# Patient Record
Sex: Female | Born: 1935 | Race: White | Hispanic: No | Marital: Married
Health system: Southern US, Community
[De-identification: ages and names within clinical notes are randomized; demographics above are authoritative.]

## PROBLEM LIST (undated history)

## (undated) DIAGNOSIS — E119 Type 2 diabetes mellitus without complications: Secondary | ICD-10-CM

## (undated) DIAGNOSIS — Z87442 Personal history of urinary calculi: Secondary | ICD-10-CM

## (undated) DIAGNOSIS — E213 Hyperparathyroidism, unspecified: Secondary | ICD-10-CM

## (undated) DIAGNOSIS — M1990 Unspecified osteoarthritis, unspecified site: Secondary | ICD-10-CM

## (undated) DIAGNOSIS — I10 Essential (primary) hypertension: Secondary | ICD-10-CM

## (undated) HISTORY — PX: APPENDECTOMY: SHX54

## (undated) HISTORY — DX: Hyperparathyroidism, unspecified: E21.3

## (undated) HISTORY — PX: ABDOMINAL HYSTERECTOMY: SHX81

## (undated) HISTORY — PX: CHOLECYSTECTOMY: SHX55

## (undated) HISTORY — DX: Essential (primary) hypertension: I10

---

## 2013-02-02 HISTORY — PX: PARATHYROIDECTOMY: SHX19

## 2015-02-25 DIAGNOSIS — E119 Type 2 diabetes mellitus without complications: Secondary | ICD-10-CM | POA: Diagnosis not present

## 2015-02-25 DIAGNOSIS — R350 Frequency of micturition: Secondary | ICD-10-CM | POA: Diagnosis not present

## 2015-02-25 DIAGNOSIS — D62 Acute posthemorrhagic anemia: Secondary | ICD-10-CM | POA: Diagnosis not present

## 2015-02-25 DIAGNOSIS — R81 Glycosuria: Secondary | ICD-10-CM | POA: Diagnosis not present

## 2015-05-06 DIAGNOSIS — H04123 Dry eye syndrome of bilateral lacrimal glands: Secondary | ICD-10-CM | POA: Diagnosis not present

## 2015-05-20 DIAGNOSIS — H04123 Dry eye syndrome of bilateral lacrimal glands: Secondary | ICD-10-CM | POA: Diagnosis not present

## 2015-07-02 DIAGNOSIS — E119 Type 2 diabetes mellitus without complications: Secondary | ICD-10-CM | POA: Diagnosis not present

## 2015-07-02 DIAGNOSIS — M2550 Pain in unspecified joint: Secondary | ICD-10-CM | POA: Diagnosis not present

## 2015-07-02 DIAGNOSIS — E213 Hyperparathyroidism, unspecified: Secondary | ICD-10-CM | POA: Diagnosis not present

## 2015-07-02 DIAGNOSIS — I10 Essential (primary) hypertension: Secondary | ICD-10-CM | POA: Diagnosis not present

## 2015-07-02 DIAGNOSIS — Z23 Encounter for immunization: Secondary | ICD-10-CM | POA: Diagnosis not present

## 2015-07-02 DIAGNOSIS — D500 Iron deficiency anemia secondary to blood loss (chronic): Secondary | ICD-10-CM | POA: Diagnosis not present

## 2015-07-02 DIAGNOSIS — E559 Vitamin D deficiency, unspecified: Secondary | ICD-10-CM | POA: Diagnosis not present

## 2015-07-02 DIAGNOSIS — E538 Deficiency of other specified B group vitamins: Secondary | ICD-10-CM | POA: Diagnosis not present

## 2015-07-02 DIAGNOSIS — I708 Atherosclerosis of other arteries: Secondary | ICD-10-CM | POA: Diagnosis not present

## 2015-07-22 DIAGNOSIS — E119 Type 2 diabetes mellitus without complications: Secondary | ICD-10-CM | POA: Diagnosis not present

## 2015-07-22 DIAGNOSIS — E538 Deficiency of other specified B group vitamins: Secondary | ICD-10-CM | POA: Diagnosis not present

## 2015-07-22 DIAGNOSIS — I708 Atherosclerosis of other arteries: Secondary | ICD-10-CM | POA: Diagnosis not present

## 2015-07-22 DIAGNOSIS — I10 Essential (primary) hypertension: Secondary | ICD-10-CM | POA: Diagnosis not present

## 2015-07-22 DIAGNOSIS — E213 Hyperparathyroidism, unspecified: Secondary | ICD-10-CM | POA: Diagnosis not present

## 2015-07-22 DIAGNOSIS — E559 Vitamin D deficiency, unspecified: Secondary | ICD-10-CM | POA: Diagnosis not present

## 2015-08-15 DIAGNOSIS — R600 Localized edema: Secondary | ICD-10-CM | POA: Diagnosis not present

## 2015-08-15 DIAGNOSIS — E213 Hyperparathyroidism, unspecified: Secondary | ICD-10-CM | POA: Diagnosis not present

## 2015-08-15 DIAGNOSIS — R609 Edema, unspecified: Secondary | ICD-10-CM | POA: Diagnosis not present

## 2015-08-15 DIAGNOSIS — R601 Generalized edema: Secondary | ICD-10-CM | POA: Diagnosis not present

## 2015-08-15 DIAGNOSIS — E119 Type 2 diabetes mellitus without complications: Secondary | ICD-10-CM | POA: Diagnosis not present

## 2015-08-20 DIAGNOSIS — M7122 Synovial cyst of popliteal space [Baker], left knee: Secondary | ICD-10-CM | POA: Diagnosis not present

## 2015-08-20 DIAGNOSIS — E119 Type 2 diabetes mellitus without complications: Secondary | ICD-10-CM | POA: Diagnosis not present

## 2015-08-20 DIAGNOSIS — E213 Hyperparathyroidism, unspecified: Secondary | ICD-10-CM | POA: Diagnosis not present

## 2015-08-20 DIAGNOSIS — I10 Essential (primary) hypertension: Secondary | ICD-10-CM | POA: Diagnosis not present

## 2015-08-20 DIAGNOSIS — R601 Generalized edema: Secondary | ICD-10-CM | POA: Diagnosis not present

## 2015-10-22 DIAGNOSIS — L570 Actinic keratosis: Secondary | ICD-10-CM | POA: Diagnosis not present

## 2015-10-22 DIAGNOSIS — L821 Other seborrheic keratosis: Secondary | ICD-10-CM | POA: Diagnosis not present

## 2015-12-17 DIAGNOSIS — Z23 Encounter for immunization: Secondary | ICD-10-CM | POA: Diagnosis not present

## 2016-01-02 DIAGNOSIS — I10 Essential (primary) hypertension: Secondary | ICD-10-CM | POA: Diagnosis not present

## 2016-01-02 DIAGNOSIS — M2550 Pain in unspecified joint: Secondary | ICD-10-CM | POA: Diagnosis not present

## 2016-01-02 DIAGNOSIS — E213 Hyperparathyroidism, unspecified: Secondary | ICD-10-CM | POA: Diagnosis not present

## 2016-01-02 DIAGNOSIS — R945 Abnormal results of liver function studies: Secondary | ICD-10-CM | POA: Diagnosis not present

## 2016-01-02 DIAGNOSIS — K8060 Calculus of gallbladder and bile duct with cholecystitis, unspecified, without obstruction: Secondary | ICD-10-CM | POA: Diagnosis not present

## 2016-01-02 DIAGNOSIS — E210 Primary hyperparathyroidism: Secondary | ICD-10-CM | POA: Diagnosis not present

## 2016-03-07 DIAGNOSIS — Z780 Asymptomatic menopausal state: Secondary | ICD-10-CM | POA: Diagnosis not present

## 2016-03-07 DIAGNOSIS — Z13820 Encounter for screening for osteoporosis: Secondary | ICD-10-CM | POA: Diagnosis not present

## 2016-03-07 DIAGNOSIS — E210 Primary hyperparathyroidism: Secondary | ICD-10-CM | POA: Diagnosis not present

## 2016-03-23 DIAGNOSIS — M25512 Pain in left shoulder: Secondary | ICD-10-CM | POA: Diagnosis not present

## 2016-12-04 DIAGNOSIS — Z23 Encounter for immunization: Secondary | ICD-10-CM | POA: Diagnosis not present

## 2016-12-08 DIAGNOSIS — R7303 Prediabetes: Secondary | ICD-10-CM | POA: Diagnosis not present

## 2016-12-08 DIAGNOSIS — Z79899 Other long term (current) drug therapy: Secondary | ICD-10-CM | POA: Diagnosis not present

## 2016-12-08 DIAGNOSIS — I10 Essential (primary) hypertension: Secondary | ICD-10-CM | POA: Diagnosis not present

## 2016-12-08 DIAGNOSIS — Z9889 Other specified postprocedural states: Secondary | ICD-10-CM | POA: Diagnosis not present

## 2016-12-11 DIAGNOSIS — E1165 Type 2 diabetes mellitus with hyperglycemia: Secondary | ICD-10-CM | POA: Diagnosis not present

## 2017-01-06 DIAGNOSIS — E1165 Type 2 diabetes mellitus with hyperglycemia: Secondary | ICD-10-CM | POA: Diagnosis not present

## 2017-01-13 DIAGNOSIS — E1165 Type 2 diabetes mellitus with hyperglycemia: Secondary | ICD-10-CM | POA: Diagnosis not present

## 2017-02-12 DIAGNOSIS — S134XXA Sprain of ligaments of cervical spine, initial encounter: Secondary | ICD-10-CM | POA: Diagnosis not present

## 2017-02-12 DIAGNOSIS — M9901 Segmental and somatic dysfunction of cervical region: Secondary | ICD-10-CM | POA: Diagnosis not present

## 2017-02-19 DIAGNOSIS — S134XXA Sprain of ligaments of cervical spine, initial encounter: Secondary | ICD-10-CM | POA: Diagnosis not present

## 2017-02-19 DIAGNOSIS — M9901 Segmental and somatic dysfunction of cervical region: Secondary | ICD-10-CM | POA: Diagnosis not present

## 2017-02-22 DIAGNOSIS — E1165 Type 2 diabetes mellitus with hyperglycemia: Secondary | ICD-10-CM | POA: Diagnosis not present

## 2017-02-26 DIAGNOSIS — M9901 Segmental and somatic dysfunction of cervical region: Secondary | ICD-10-CM | POA: Diagnosis not present

## 2017-02-26 DIAGNOSIS — S134XXA Sprain of ligaments of cervical spine, initial encounter: Secondary | ICD-10-CM | POA: Diagnosis not present

## 2017-03-03 DIAGNOSIS — M9901 Segmental and somatic dysfunction of cervical region: Secondary | ICD-10-CM | POA: Diagnosis not present

## 2017-03-03 DIAGNOSIS — S134XXA Sprain of ligaments of cervical spine, initial encounter: Secondary | ICD-10-CM | POA: Diagnosis not present

## 2017-03-10 DIAGNOSIS — M9901 Segmental and somatic dysfunction of cervical region: Secondary | ICD-10-CM | POA: Diagnosis not present

## 2017-03-10 DIAGNOSIS — S134XXA Sprain of ligaments of cervical spine, initial encounter: Secondary | ICD-10-CM | POA: Diagnosis not present

## 2017-03-17 DIAGNOSIS — S134XXA Sprain of ligaments of cervical spine, initial encounter: Secondary | ICD-10-CM | POA: Diagnosis not present

## 2017-03-17 DIAGNOSIS — M9901 Segmental and somatic dysfunction of cervical region: Secondary | ICD-10-CM | POA: Diagnosis not present

## 2017-03-19 DIAGNOSIS — S134XXA Sprain of ligaments of cervical spine, initial encounter: Secondary | ICD-10-CM | POA: Diagnosis not present

## 2017-03-19 DIAGNOSIS — M9901 Segmental and somatic dysfunction of cervical region: Secondary | ICD-10-CM | POA: Diagnosis not present

## 2017-03-24 DIAGNOSIS — M9901 Segmental and somatic dysfunction of cervical region: Secondary | ICD-10-CM | POA: Diagnosis not present

## 2017-03-24 DIAGNOSIS — S134XXA Sprain of ligaments of cervical spine, initial encounter: Secondary | ICD-10-CM | POA: Diagnosis not present

## 2017-03-31 DIAGNOSIS — M9901 Segmental and somatic dysfunction of cervical region: Secondary | ICD-10-CM | POA: Diagnosis not present

## 2017-03-31 DIAGNOSIS — S134XXA Sprain of ligaments of cervical spine, initial encounter: Secondary | ICD-10-CM | POA: Diagnosis not present

## 2017-04-12 DIAGNOSIS — I10 Essential (primary) hypertension: Secondary | ICD-10-CM | POA: Diagnosis not present

## 2017-04-12 DIAGNOSIS — M25511 Pain in right shoulder: Secondary | ICD-10-CM | POA: Diagnosis not present

## 2017-04-12 DIAGNOSIS — G8929 Other chronic pain: Secondary | ICD-10-CM | POA: Diagnosis not present

## 2017-04-12 DIAGNOSIS — E1165 Type 2 diabetes mellitus with hyperglycemia: Secondary | ICD-10-CM | POA: Diagnosis not present

## 2017-04-12 DIAGNOSIS — Z79899 Other long term (current) drug therapy: Secondary | ICD-10-CM | POA: Diagnosis not present

## 2017-04-12 DIAGNOSIS — M25512 Pain in left shoulder: Secondary | ICD-10-CM | POA: Diagnosis not present

## 2017-04-13 DIAGNOSIS — M50322 Other cervical disc degeneration at C5-C6 level: Secondary | ICD-10-CM | POA: Diagnosis not present

## 2017-04-13 DIAGNOSIS — M47812 Spondylosis without myelopathy or radiculopathy, cervical region: Secondary | ICD-10-CM | POA: Diagnosis not present

## 2017-04-13 DIAGNOSIS — M5033 Other cervical disc degeneration, cervicothoracic region: Secondary | ICD-10-CM | POA: Diagnosis not present

## 2017-04-13 DIAGNOSIS — M25511 Pain in right shoulder: Secondary | ICD-10-CM | POA: Diagnosis not present

## 2017-04-13 DIAGNOSIS — M50323 Other cervical disc degeneration at C6-C7 level: Secondary | ICD-10-CM | POA: Diagnosis not present

## 2017-04-13 DIAGNOSIS — M25512 Pain in left shoulder: Secondary | ICD-10-CM | POA: Diagnosis not present

## 2017-04-14 DIAGNOSIS — M9901 Segmental and somatic dysfunction of cervical region: Secondary | ICD-10-CM | POA: Diagnosis not present

## 2017-04-14 DIAGNOSIS — S134XXA Sprain of ligaments of cervical spine, initial encounter: Secondary | ICD-10-CM | POA: Diagnosis not present

## 2017-04-28 DIAGNOSIS — S134XXA Sprain of ligaments of cervical spine, initial encounter: Secondary | ICD-10-CM | POA: Diagnosis not present

## 2017-04-28 DIAGNOSIS — M9901 Segmental and somatic dysfunction of cervical region: Secondary | ICD-10-CM | POA: Diagnosis not present

## 2017-05-03 DIAGNOSIS — I10 Essential (primary) hypertension: Secondary | ICD-10-CM | POA: Diagnosis not present

## 2017-05-03 DIAGNOSIS — M25519 Pain in unspecified shoulder: Secondary | ICD-10-CM | POA: Diagnosis not present

## 2017-05-03 DIAGNOSIS — E119 Type 2 diabetes mellitus without complications: Secondary | ICD-10-CM | POA: Diagnosis not present

## 2017-05-26 DIAGNOSIS — S134XXA Sprain of ligaments of cervical spine, initial encounter: Secondary | ICD-10-CM | POA: Diagnosis not present

## 2017-05-26 DIAGNOSIS — M9901 Segmental and somatic dysfunction of cervical region: Secondary | ICD-10-CM | POA: Diagnosis not present

## 2017-08-13 ENCOUNTER — Other Ambulatory Visit (INDEPENDENT_AMBULATORY_CARE_PROVIDER_SITE_OTHER): Payer: Self-pay | Admitting: Physician Assistant

## 2017-08-13 ENCOUNTER — Ambulatory Visit
Admission: RE | Admit: 2017-08-13 | Discharge: 2017-08-13 | Disposition: A | Discharge status: Home / Self Care | Payer: Medicare Other | Source: Ambulatory Visit | Attending: Physician Assistant | Admitting: Physician Assistant

## 2017-08-13 DIAGNOSIS — M25512 Pain in left shoulder: Secondary | ICD-10-CM | POA: Diagnosis not present

## 2017-08-13 DIAGNOSIS — M25511 Pain in right shoulder: Secondary | ICD-10-CM | POA: Diagnosis not present

## 2017-08-13 DIAGNOSIS — M25561 Pain in right knee: Secondary | ICD-10-CM

## 2017-08-13 DIAGNOSIS — M25562 Pain in left knee: Secondary | ICD-10-CM

## 2017-08-13 DIAGNOSIS — M1712 Unilateral primary osteoarthritis, left knee: Secondary | ICD-10-CM | POA: Diagnosis not present

## 2017-08-13 DIAGNOSIS — M542 Cervicalgia: Secondary | ICD-10-CM | POA: Diagnosis not present

## 2017-08-13 DIAGNOSIS — M1711 Unilateral primary osteoarthritis, right knee: Secondary | ICD-10-CM | POA: Diagnosis not present

## 2017-08-13 DIAGNOSIS — M5136 Other intervertebral disc degeneration, lumbar region: Secondary | ICD-10-CM | POA: Diagnosis not present

## 2017-12-13 DIAGNOSIS — I10 Essential (primary) hypertension: Secondary | ICD-10-CM | POA: Diagnosis not present

## 2018-03-23 DIAGNOSIS — Z23 Encounter for immunization: Secondary | ICD-10-CM | POA: Diagnosis not present

## 2018-08-24 DIAGNOSIS — Z760 Encounter for issue of repeat prescription: Secondary | ICD-10-CM | POA: Diagnosis not present

## 2018-08-24 DIAGNOSIS — I10 Essential (primary) hypertension: Secondary | ICD-10-CM | POA: Diagnosis not present

## 2018-08-30 DIAGNOSIS — Z23 Encounter for immunization: Secondary | ICD-10-CM | POA: Diagnosis not present

## 2018-09-01 DIAGNOSIS — R21 Rash and other nonspecific skin eruption: Secondary | ICD-10-CM | POA: Diagnosis not present

## 2018-09-27 DIAGNOSIS — I10 Essential (primary) hypertension: Secondary | ICD-10-CM | POA: Diagnosis not present

## 2018-09-27 DIAGNOSIS — Z1159 Encounter for screening for other viral diseases: Secondary | ICD-10-CM | POA: Diagnosis not present

## 2018-09-27 DIAGNOSIS — E210 Primary hyperparathyroidism: Secondary | ICD-10-CM | POA: Diagnosis not present

## 2018-09-27 DIAGNOSIS — Z6828 Body mass index (BMI) 28.0-28.9, adult: Secondary | ICD-10-CM | POA: Diagnosis not present

## 2018-11-23 DIAGNOSIS — M791 Myalgia, unspecified site: Secondary | ICD-10-CM | POA: Diagnosis not present

## 2018-11-23 DIAGNOSIS — Z1159 Encounter for screening for other viral diseases: Secondary | ICD-10-CM | POA: Diagnosis not present

## 2018-11-23 DIAGNOSIS — R519 Headache, unspecified: Secondary | ICD-10-CM | POA: Diagnosis not present

## 2018-11-23 DIAGNOSIS — R509 Fever, unspecified: Secondary | ICD-10-CM | POA: Diagnosis not present

## 2018-12-20 DIAGNOSIS — M25572 Pain in left ankle and joints of left foot: Secondary | ICD-10-CM | POA: Diagnosis not present

## 2018-12-20 DIAGNOSIS — S8992XA Unspecified injury of left lower leg, initial encounter: Secondary | ICD-10-CM | POA: Diagnosis not present

## 2018-12-20 DIAGNOSIS — M7989 Other specified soft tissue disorders: Secondary | ICD-10-CM | POA: Diagnosis not present

## 2018-12-20 DIAGNOSIS — S99912A Unspecified injury of left ankle, initial encounter: Secondary | ICD-10-CM | POA: Diagnosis not present

## 2018-12-20 DIAGNOSIS — S99922A Unspecified injury of left foot, initial encounter: Secondary | ICD-10-CM | POA: Diagnosis not present

## 2018-12-28 ENCOUNTER — Other Ambulatory Visit: Payer: Self-pay

## 2019-03-29 DIAGNOSIS — E210 Primary hyperparathyroidism: Secondary | ICD-10-CM | POA: Insufficient documentation

## 2019-03-29 DIAGNOSIS — I10 Essential (primary) hypertension: Secondary | ICD-10-CM | POA: Insufficient documentation

## 2019-03-30 ENCOUNTER — Other Ambulatory Visit: Payer: Self-pay

## 2019-03-30 ENCOUNTER — Encounter: Payer: Self-pay | Admitting: Legal Medicine

## 2019-03-30 ENCOUNTER — Ambulatory Visit (INDEPENDENT_AMBULATORY_CARE_PROVIDER_SITE_OTHER): Payer: Medicare Other | Admitting: Legal Medicine

## 2019-03-30 DIAGNOSIS — I10 Essential (primary) hypertension: Secondary | ICD-10-CM | POA: Diagnosis not present

## 2019-03-30 DIAGNOSIS — E210 Primary hyperparathyroidism: Secondary | ICD-10-CM | POA: Diagnosis not present

## 2019-03-30 LAB — COMPREHENSIVE METABOLIC PANEL
ALT: 15 IU/L (ref 0–32)
AST: 14 IU/L (ref 0–40)
Albumin/Globulin Ratio: 1.7 (ref 1.2–2.2)
Albumin: 4.3 g/dL (ref 3.6–4.6)
Alkaline Phosphatase: 98 IU/L (ref 39–117)
BUN/Creatinine Ratio: 20 (ref 12–28)
BUN: 18 mg/dL (ref 8–27)
Bilirubin Total: 0.7 mg/dL (ref 0.0–1.2)
CO2: 23 mmol/L (ref 20–29)
Calcium: 10 mg/dL (ref 8.7–10.3)
Chloride: 98 mmol/L (ref 96–106)
Creatinine, Ser: 0.9 mg/dL (ref 0.57–1.00)
GFR calc Af Amer: 68 mL/min/{1.73_m2} (ref >59)
GFR calc non Af Amer: 59 mL/min/{1.73_m2} — ABNORMAL LOW (ref >59)
Globulin, Total: 2.5 g/dL (ref 1.5–4.5)
Glucose: 270 mg/dL — ABNORMAL HIGH (ref 65–99)
Potassium: 4.3 mmol/L (ref 3.5–5.2)
Sodium: 138 mmol/L (ref 134–144)
Total Protein: 6.8 g/dL (ref 6.0–8.5)

## 2019-03-30 LAB — CBC WITH DIFFERENTIAL/PLATELET
Basophils Absolute: 0.2 10*3/uL (ref 0.0–0.2)
Basos: 2 %
EOS (ABSOLUTE): 0.2 10*3/uL (ref 0.0–0.4)
Eos: 2 %
Hematocrit: 40.6 % (ref 34.0–46.6)
Hemoglobin: 12.1 g/dL (ref 11.1–15.9)
Immature Grans (Abs): 0.1 10*3/uL (ref 0.0–0.1)
Immature Granulocytes: 1 %
Lymphocytes Absolute: 0.8 10*3/uL (ref 0.7–3.1)
Lymphs: 11 %
MCH: 21.8 pg — ABNORMAL LOW (ref 26.6–33.0)
MCHC: 29.8 g/dL — ABNORMAL LOW (ref 31.5–35.7)
MCV: 73 fL — ABNORMAL LOW (ref 79–97)
Monocytes Absolute: 0.5 10*3/uL (ref 0.1–0.9)
Monocytes: 7 %
Neutrophils Absolute: 5.6 10*3/uL (ref 1.4–7.0)
Neutrophils: 77 %
Platelets: 194 10*3/uL (ref 150–450)
RBC: 5.56 x10E6/uL — ABNORMAL HIGH (ref 3.77–5.28)
RDW: 16.7 % — ABNORMAL HIGH (ref 11.7–15.4)
WBC: 7.3 10*3/uL (ref 3.4–10.8)

## 2019-03-30 NOTE — Assessment & Plan Note (Signed)
An individual care plan was established and reinforced today.  The patient's status was assessed using clinical findings on exam and labs or diagnostic tests. The patient's success at meeting treatment goals on disease specific evidence-based guidelines and found to be well controlled. SELF MANAGEMENT: The patient and I together assessed ways to personally work towards obtaining the recommended goals. RECOMMENDATIONS: avoid decongestants found in common cold remedies, decrease consumption of alcohol, perform routine monitoring of BP with home BP cuff, exercise, reduction of dietary salt, take medicines as prescribed, try not to miss doses and quit smoking.  Regular exercise and maintaining a healthy weight is needed.  Stress reduction may help. A CLINICAL SUMMARY including written plan identify barriers to care unique to individual due to social or financial issues.  We attempt to mutually create solutions for individual and family understanding.

## 2019-03-30 NOTE — Patient Instructions (Signed)
Osteoporosis  Osteoporosis happens when your bones get thin and weak. This can cause your bones to break (fracture) more easily. You can do things at home to make your bones stronger. Follow these instructions at home:  Activity  Exercise as told by your doctor. Ask your doctor what activities are safe for you. You should do: ? Exercises that make your muscles work to hold your body weight up (weight-bearing exercises). These include tai chi, yoga, and walking. ? Exercises to make your muscles stronger. One example is lifting weights. Lifestyle  Limit alcohol intake to no more than 1 drink a day for nonpregnant women and 2 drinks a day for men. One drink equals 12 oz of beer, 5 oz of wine, or 1 oz of hard liquor.  Do not use any products that have nicotine or tobacco in them. These include cigarettes and e-cigarettes. If you need help quitting, ask your doctor. Preventing falls  Use tools to help you move around (mobility aids) as needed. These include canes, walkers, scooters, and crutches.  Keep rooms well-lit and free of clutter.  Put away things that could make you trip. These include cords and rugs.  Install safety rails on stairs. Install grab bars in bathrooms.  Use rubber mats in slippery areas, like bathrooms.  Wear shoes that: ? Fit you well. ? Support your feet. ? Have closed toes. ? Have rubber soles or low heels.  Tell your doctor about all of the medicines you are taking. Some medicines can make you more likely to fall. General instructions  Eat plenty of calcium and vitamin D. These nutrients are good for your bones. Good sources of calcium and vitamin D include: ? Some fatty fish, such as salmon and tuna. ? Foods that have calcium and vitamin D added to them (fortified foods). For example, some breakfast cereals are fortified with calcium and vitamin D. ? Egg yolks. ? Cheese. ? Liver.  Take over-the-counter and prescription medicines only as told by your  doctor.  Keep all follow-up visits as told by your doctor. This is important. Contact a doctor if:  You have not been tested (screened) for osteoporosis and you are: ? A woman who is age 65 or older. ? A man who is age 70 or older. Get help right away if:  You fall.  You get hurt. Summary  Osteoporosis happens when your bones get thin and weak.  Weak bones can break (fracture) more easily.  Eat plenty of calcium and vitamin D. These nutrients are good for your bones.  Tell your doctor about all of the medicines that you take. This information is not intended to replace advice given to you by your health care provider. Make sure you discuss any questions you have with your health care provider. Document Revised: 01/01/2017 Document Reviewed: 11/13/2016 Elsevier Patient Education  2020 [employer]  

## 2019-03-30 NOTE — Assessment & Plan Note (Signed)
AN INDIVIDUAL CARE PLAN was established and reinforced today.  The patient's status was assessed using clinical findings on exam, labs, and other diagnostic testing. Patient's success at meeting treatment goals based on disease specific evidence-bassed guidelines and found to be in good control. RECOMMENDATIONS include maintaiing present medicines and treatment. She needs a current DEXA.

## 2019-03-30 NOTE — Progress Notes (Signed)
Established Patient Office Visit  Subjective:  Patient ID: Erin Webster, female    DOB: 03/06/1934  Age: 85 y.o. MRN: [id number]  CC:  Chief Complaint  Patient presents with  . hyperparathyroidism  . Hypertension    HPI [name] presents for Chronic visit  Patient presents for follow up of hypertension.  Patient tolerating coreg, lisinopril/HCTZ well with side effects.  Patient was diagnosed with hypertension 1991 so has been treated for hypertension for 30 years.Patient is working on maintaining diet and exercise regimen and follows up as directed. Complication include none  Patient has hyperparathyroidism in 2015.  She is well controlled.She had kidney stones.  Last DEXA 2015. No fractures or renal failure.  Past Medical History:  Diagnosis Date  . Hyperparathyroidism ([place])   . Hypertension     Past Surgical History:  Procedure Laterality Date  . CHOLECYSTECTOMY    . PARATHYROIDECTOMY  2015    Family History  Problem Relation Age of Onset  . Heart disease Mother     Social History   Socioeconomic History  . Marital status: Married    Spouse name: Not on file  . Number of children: Not on file  . Years of education: Not on file  . Highest education level: Not on file  Occupational History  . Occupation: retired  Tobacco Use  . Smoking status: Never Smoker  . Smokeless tobacco: Never Used  Substance and Sexual Activity  . Alcohol use: Yes    Comment: seldomn  . Drug use: Never  . Sexual activity: Not on file  Other Topics Concern  . Not on file  Social History Narrative  . Not on file   Social Determinants of Health   Financial Resource Strain:   . Difficulty of Paying Living Expenses: Not on file  Food Insecurity:   . Worried About Charity fundraiser in the Last Year: Not on file  . Ran Out of Food in the Last Year: Not on file  Transportation Needs:   . Lack of Transportation (Medical): Not on file  . Lack of Transportation  (Non-Medical): Not on file  Physical Activity:   . Days of Exercise per Week: Not on file  . Minutes of Exercise per Session: Not on file  Stress:   . Feeling of Stress : Not on file  Social Connections:   . Frequency of Communication with Friends and Family: Not on file  . Frequency of Social Gatherings with Friends and Family: Not on file  . Attends Religious Services: Not on file  . Active Member of Clubs or Organizations: Not on file  . Attends Archivist Meetings: Not on file  . Marital Status: Not on file  Intimate Partner Violence:   . Fear of Current or Ex-Partner: Not on file  . Emotionally Abused: Not on file  . Physically Abused: Not on file  . Sexually Abused: Not on file    Outpatient Medications Prior to Visit  Medication Sig Dispense Refill  . carvedilol (COREG) 12.5 MG tablet Take 12.5 mg by mouth 2 (two) times daily with a meal.    . lisinopril-hydrochlorothiazide (ZESTORETIC) 20-25 MG tablet Take 1 tablet by mouth daily.    . meclizine (ANTIVERT) 25 MG tablet Take 25 mg by mouth 3 (three) times daily as needed.     No facility-administered medications prior to visit.    No Known Allergies  ROS Review of Systems  Constitutional: Negative.   HENT: Negative.   Eyes: Negative.  Respiratory: Negative.   Cardiovascular: Negative.   Gastrointestinal: Negative.   Endocrine: Negative.   Genitourinary: Negative.   Musculoskeletal: Negative.   Skin: Negative.   Neurological: Negative.       Objective:    Physical Exam  Constitutional: She is oriented to person, place, and time. She appears well-developed and well-nourished.  HENT:  Head: Normocephalic and atraumatic.  Eyes: Pupils are equal, round, and reactive to light. Conjunctivae and EOM are normal.  Cardiovascular: Normal rate, regular rhythm and normal heart sounds.  Pulmonary/Chest: Effort normal and breath sounds normal.  Abdominal: Soft. Bowel sounds are normal.  Musculoskeletal:          General: Normal range of motion.     Cervical back: Normal range of motion and neck supple.  Neurological: She is alert and oriented to person, place, and time. She has normal reflexes.  Skin: Skin is warm and dry.  Psychiatric: She has a normal mood and affect.  Vitals reviewed.   BP 128/82   Pulse 76   Temp (!) 97 F (36.1 C)   Ht 5' 7"  (1.702 m)   Wt 174 lb 6.4 oz (79.1 kg)   SpO2 96%   BMI 27.31 kg/m  Wt Readings from Last 3 Encounters:  03/30/19 174 lb 6.4 oz (79.1 kg)     Health Maintenance Due  Topic Date Due  . TETANUS/TDAP  02/19/1955  . DEXA SCAN  02/18/2001  . PNA vac Low Risk Adult (1 of 2 - PCV13) 02/18/2001  . INFLUENZA VACCINE  09/03/2018    There are no preventive care reminders to display for this patient.  No results found for: TSH No results found for: WBC, HGB, HCT, MCV, PLT No results found for: NA, K, CHLORIDE, CO2, GLUCOSE, BUN, CREATININE, BILITOT, ALKPHOS, AST, ALT, PROT, ALBUMIN, CALCIUM, ANIONGAP, EGFR, GFR No results found for: CHOL No results found for: HDL No results found for: LDLCALC No results found for: TRIG No results found for: CHOLHDL No results found for: HGBA1C    Assessment & Plan:   Problem List Items Addressed This Visit      Cardiovascular and Mediastinum   Benign hypertension    An individual care plan was established and reinforced today.  The patient's status was assessed using clinical findings on exam and labs or diagnostic tests. The patient's success at meeting treatment goals on disease specific evidence-based guidelines and found to be well controlled. SELF MANAGEMENT: The patient and I together assessed ways to personally work towards obtaining the recommended goals. RECOMMENDATIONS: avoid decongestants found in common cold remedies, decrease consumption of alcohol, perform routine monitoring of BP with home BP cuff, exercise, reduction of dietary salt, take medicines as prescribed, try not to miss doses and  quit smoking.  Regular exercise and maintaining a healthy weight is needed.  Stress reduction may help. A CLINICAL SUMMARY including written plan identify barriers to care unique to individual due to social or financial issues.  We attempt to mutually create solutions for individual and family understanding.      Relevant Medications   lisinopril-hydrochlorothiazide (ZESTORETIC) 20-25 MG tablet   carvedilol (COREG) 12.5 MG tablet   Other Relevant Orders   CBC with Differential   Comprehensive metabolic panel     Endocrine   Primary hyperparathyroidism ([place])    AN INDIVIDUAL CARE PLAN was established and reinforced today.  The patient's status was assessed using clinical findings on exam, labs, and other diagnostic testing. Patient's success at meeting treatment goals based  on disease specific evidence-bassed guidelines and found to be in good control. RECOMMENDATIONS include maintaiing present medicines and treatment. She needs a current DEXA.      Relevant Orders   DG Bone Density      No orders of the defined types were placed in this encounter.   Follow-up: Return in about 6 months (around 09/27/2019) for fasting.    Reinaldo Meeker, MD

## 2019-03-31 NOTE — Progress Notes (Signed)
No anemia, blood cells are small add ferritin to lab for iron deficiency, glucose 270, kidney tests stable, liver tests ok,  lp

## 2019-04-03 ENCOUNTER — Other Ambulatory Visit: Payer: Self-pay

## 2019-04-03 DIAGNOSIS — R739 Hyperglycemia, unspecified: Secondary | ICD-10-CM

## 2019-04-03 DIAGNOSIS — E611 Iron deficiency: Secondary | ICD-10-CM

## 2019-04-12 ENCOUNTER — Encounter: Payer: Self-pay | Admitting: Legal Medicine

## 2019-04-12 ENCOUNTER — Other Ambulatory Visit: Payer: Self-pay | Admitting: Legal Medicine

## 2019-04-12 DIAGNOSIS — D508 Other iron deficiency anemias: Secondary | ICD-10-CM

## 2019-04-12 DIAGNOSIS — E1169 Type 2 diabetes mellitus with other specified complication: Secondary | ICD-10-CM | POA: Insufficient documentation

## 2019-04-12 LAB — SPECIMEN STATUS REPORT

## 2019-04-12 LAB — HGB A1C W/O EAG: Hgb A1c MFr Bld: 9.6 % — ABNORMAL HIGH (ref 4.8–5.6)

## 2019-04-12 LAB — FERRITIN: Ferritin: 11 ng/mL — ABNORMAL LOW (ref 15–150)

## 2019-04-12 MED ORDER — FERROUS SULFATE 324 (65 FE) MG PO TBEC: 1.0000 | DELAYED_RELEASE_TABLET | Freq: Every day | ORAL | 3 refills | Status: DC

## 2019-04-12 NOTE — Progress Notes (Signed)
A1c 9.6 very high. Iron stores low, patient is a new diabetic- need to see for NEW DIABETES, start oral iron lp

## 2019-04-20 ENCOUNTER — Ambulatory Visit (INDEPENDENT_AMBULATORY_CARE_PROVIDER_SITE_OTHER): Payer: Medicare Other | Admitting: Legal Medicine

## 2019-04-20 ENCOUNTER — Other Ambulatory Visit: Payer: Self-pay

## 2019-04-20 ENCOUNTER — Encounter: Payer: Self-pay | Admitting: Legal Medicine

## 2019-04-20 VITALS — BP 130/70 | HR 70 | Temp 96.9°F | Resp 17 | Ht 67.0 in | Wt 174.2 lb

## 2019-04-20 DIAGNOSIS — E1169 Type 2 diabetes mellitus with other specified complication: Secondary | ICD-10-CM | POA: Diagnosis not present

## 2019-04-20 DIAGNOSIS — E669 Obesity, unspecified: Secondary | ICD-10-CM

## 2019-04-20 MED ORDER — ONETOUCH CLUB LANCETS FINE PT MISC: 1.0000 "application " | Freq: Once | 5 refills | Status: DC

## 2019-04-20 MED ORDER — ONETOUCH ULTRA VI STRP: ORAL_STRIP | 12 refills | Status: DC

## 2019-04-20 MED ORDER — METFORMIN HCL 500 MG PO TABS
500.0000 mg | ORAL_TABLET | Freq: Two times a day (BID) | ORAL | 3 refills | Status: DC
Start: 2019-04-20 — End: 2023-03-25

## 2019-04-20 MED ORDER — ONETOUCH ULTRA 2 W/DEVICE KIT: 1.0000 | PACK | Freq: Every morning | 0 refills | Status: AC

## 2019-04-20 NOTE — Patient Instructions (Signed)

## 2019-04-20 NOTE — Progress Notes (Signed)
Established Patient Office Visit  Subjective:  Patient ID: Erin Webster, female    DOB: 16-Jul-1934  Age: 85 y.o. MRN: [id number]  CC:  Chief Complaint  Patient presents with  . Diabetes    Start Diabetic medication     HPI Erin Webster presents for start diabetes treatment.  A!c 9.6. She  nas no blurred vision or polyuria.  Some headaches.  Need to start on metformin. Erin Webster discussed diabetes and diet and demonstrated finger stick.  Past Medical History:  Diagnosis Date  . Hyperparathyroidism ([place])   . Hypertension     Past Surgical History:  Procedure Laterality Date  . CHOLECYSTECTOMY    . PARATHYROIDECTOMY  2015    Family History  Problem Relation Age of Onset  . Heart disease Mother     Social History   Socioeconomic History  . Marital status: Married    Spouse name: Not on file  . Number of children: Not on file  . Years of education: Not on file  . Highest education level: Not on file  Occupational History  . Occupation: retired  Tobacco Use  . Smoking status: Never Smoker  . Smokeless tobacco: Never Used  Substance and Sexual Activity  . Alcohol use: Yes    Alcohol/week: 1.0 standard drinks    Types: 1 Glasses of wine per week    Comment: Seldom  . Drug use: Never  . Sexual activity: Not Currently  Other Topics Concern  . Not on file  Social History Narrative  . Not on file   Social Determinants of Health   Financial Resource Strain:   . Difficulty of Paying Living Expenses:   Food Insecurity:   . Worried About Charity fundraiser in the Last Year:   . Arboriculturist in the Last Year:   Transportation Needs:   . Film/video editor (Medical):   Marland Kitchen Lack of Transportation (Non-Medical):   Physical Activity:   . Days of Exercise per Week:   . Minutes of Exercise per Session:   Stress:   . Feeling of Stress :   Social Connections:   . Frequency of Communication with Friends and Family:   . Frequency of Social Gatherings with  Friends and Family:   . Attends Religious Services:   . Active Member of Clubs or Organizations:   . Attends Archivist Meetings:   Marland Kitchen Marital Status:   Intimate Partner Violence:   . Fear of Current or Ex-Partner:   . Emotionally Abused:   Marland Kitchen Physically Abused:   . Sexually Abused:     Outpatient Medications Prior to Visit  Medication Sig Dispense Refill  . carvedilol (COREG) 12.5 MG tablet Take 12.5 mg by mouth 2 (two) times daily with a meal.    . ferrous sulfate 324 (65 Fe) MG TBEC Take 1 tablet (325 mg total) by mouth daily. 30 tablet 3  . lisinopril-hydrochlorothiazide (ZESTORETIC) 20-25 MG tablet Take 1 tablet by mouth daily.     No facility-administered medications prior to visit.    No Known Allergies  ROS Review of Systems  Constitutional: Negative.   HENT: Negative.   Eyes: Negative.   Respiratory: Negative.   Cardiovascular: Negative.   Gastrointestinal: Negative.   Endocrine: Negative.   Genitourinary: Negative.   Musculoskeletal: Negative.   Skin: Negative.   Neurological: Negative.   Psychiatric/Behavioral: Negative.       Objective:    Physical Exam  Constitutional: She appears well-developed and well-nourished.  HENT:  Head: Normocephalic and atraumatic.  Cardiovascular: Normal rate, regular rhythm and normal heart sounds.  Pulmonary/Chest: Effort normal and breath sounds normal.  Vitals reviewed.   BP 130/70 (BP Location: Right Arm, Patient Position: Sitting)   Pulse 70   Temp (!) 96.9 F (36.1 C) (Temporal)   Resp 17   Ht 5' 7"  (1.702 m)   Wt 174 lb 3.2 oz (79 kg)   BMI 27.28 kg/m  Wt Readings from Last 3 Encounters:  04/20/19 174 lb 3.2 oz (79 kg)  03/30/19 174 lb 6.4 oz (79.1 kg)   Diabetic Foot Exam - Simple   Simple Foot Form Diabetic Foot exam was performed with the following findings: Yes 04/20/2019 10:35 AM  Visual Inspection No deformities, no ulcerations, no other skin breakdown bilaterally: Yes Sensation  Testing Intact to touch and monofilament testing bilaterally: Yes Pulse Check Posterior Tibialis and Dorsalis pulse intact bilaterally: Yes Comments     Health Maintenance Due  Topic Date Due  . OPHTHALMOLOGY EXAM  Never done  . TETANUS/TDAP  Never done  . DEXA SCAN  Never done  . PNA vac Low Risk Adult (1 of 2 - PCV13) Never done  . INFLUENZA VACCINE  09/03/2018    There are no preventive care reminders to display for this patient.  No results found for: TSH Lab Results  Component Value Date   WBC 7.3 03/30/2019   HGB 12.1 03/30/2019   HCT 40.6 03/30/2019   MCV 73 (L) 03/30/2019   PLT 194 03/30/2019   Lab Results  Component Value Date   NA 138 03/30/2019   K 4.3 03/30/2019   CO2 23 03/30/2019   GLUCOSE 270 (H) 03/30/2019   BUN 18 03/30/2019   CREATININE 0.90 03/30/2019   BILITOT 0.7 03/30/2019   ALKPHOS 98 03/30/2019   AST 14 03/30/2019   ALT 15 03/30/2019   PROT 6.8 03/30/2019   ALBUMIN 4.3 03/30/2019   CALCIUM 10.0 03/30/2019   No results found for: CHOL No results found for: HDL No results found for: LDLCALC No results found for: TRIG No results found for: CHOLHDL Lab Results  Component Value Date   HGBA1C 9.6 (H) 03/30/2019      Assessment & Plan:   Problem List Items Addressed This Visit      Endocrine   Diabetes mellitus type 2 in obese [facility]) - Primary    Patient is to check her blood sugar fasting in AM and keep record.  Use metformin bid, follow up one month to evaluate progress.  Continue education.      Relevant Medications   metFORMIN (GLUCOPHAGE) 500 MG tablet   Blood Glucose Monitoring Suppl (ONE TOUCH ULTRA 2) w/Device KIT   glucose blood (ONETOUCH ULTRA) test strip   ONE TOUCH CLUB LANCETS MISC      Meds ordered this encounter  Medications  . metFORMIN (GLUCOPHAGE) 500 MG tablet    Sig: Take 1 tablet (500 mg total) by mouth 2 (two) times daily with a meal.    Dispense:  180 tablet    Refill:  3  . Blood Glucose Monitoring  Suppl (ONE TOUCH ULTRA 2) w/Device KIT    Sig: 1 kit by Does not apply route every morning.    Dispense:  1 kit    Refill:  0  . glucose blood (ONETOUCH ULTRA) test strip    Sig: Use as instructed    Dispense:  100 each    Refill:  12  . ONE TOUCH CLUB LANCETS  MISC    Sig: 1 application by Does not apply route once for 1 dose.    Dispense:  100 each    Refill:  5    Follow-up: Return in about 4 weeks (around 05/18/2019).    Reinaldo Meeker, MD

## 2019-04-20 NOTE — Assessment & Plan Note (Signed)
Patient is to check her blood sugar fasting in AM and keep record.  Use metformin bid, follow up one month to evaluate progress.  Continue education.

## 2019-04-26 ENCOUNTER — Other Ambulatory Visit: Payer: Self-pay

## 2019-04-26 DIAGNOSIS — E1169 Type 2 diabetes mellitus with other specified complication: Secondary | ICD-10-CM

## 2019-05-17 ENCOUNTER — Encounter: Payer: Self-pay | Admitting: Legal Medicine

## 2019-05-17 ENCOUNTER — Other Ambulatory Visit: Payer: Self-pay

## 2019-05-17 ENCOUNTER — Ambulatory Visit (INDEPENDENT_AMBULATORY_CARE_PROVIDER_SITE_OTHER): Payer: Medicare Other | Admitting: Legal Medicine

## 2019-05-17 ENCOUNTER — Other Ambulatory Visit: Payer: Self-pay | Admitting: Legal Medicine

## 2019-05-17 DIAGNOSIS — E669 Obesity, unspecified: Secondary | ICD-10-CM | POA: Diagnosis not present

## 2019-05-17 DIAGNOSIS — E1169 Type 2 diabetes mellitus with other specified complication: Secondary | ICD-10-CM | POA: Diagnosis not present

## 2019-05-17 DIAGNOSIS — Z6826 Body mass index (BMI) 26.0-26.9, adult: Secondary | ICD-10-CM | POA: Diagnosis not present

## 2019-05-17 LAB — POCT UA - MICROALBUMIN: Microalbumin Ur, POC: 10 mg/L

## 2019-05-17 NOTE — Assessment & Plan Note (Signed)
An individual care plan for diabetes was established and reinforced today.  The patient's status was assessed using clinical findings on exam, labs and diagnostic testing. Patient success at meeting goals based on disease specific evidence-based guidelines and found to be fair controlled. Medications were assessed and patient's understanding of the medical issues , including barriers were assessed. Recommend adherence to a diabetic diet, a graduated exercise program, HgbA1c level is checked quarterly, and urine microalbumin performed yearly .  Annual mono-filament sensation testing performed. Lower blood pressure and control hyperlipidemia is important. Get annual eye exams and annual flu shots and smoking cessation discussed.  Self management goals were discussed. We discussed increasing metformin to tid.  Follow up 2 months

## 2019-05-17 NOTE — Progress Notes (Signed)
Established Patient Office Visit  Subjective:  Patient ID: Erin Webster, female    DOB: 08-30-1934  Age: 85 y.o. MRN: [id number]  CC:  Chief Complaint  Patient presents with  . Diabetes    follo up of new medication for diabetes    HPI Erin Webster presents for follow up on diabetes.  Patient present with type 2 diabetes.  Specifically, this is type 2, nonisnulin requiring diabetes, complicated by hypertension/hyperlipidemia.  Compliance with treatment has been good; patient take medicines as directed, maintains diet and exercise regimen, follows up as directed, and is keeping glucose diary.  Date of  diagnosis 2021.  Depression screen has been performed.Tobacco screen nonsmoker. Current medicines for diabetes metformin.  Patient is on lisinopril for renal protection and diet for cholesterol control.  Patient performs foot exams daily and last ophthalmologic exam was this year. She was just started on metformin one month ago and BS are running 233 to 160. Last A1c was 9.6. Past Medical History:  Diagnosis Date  . Hyperparathyroidism ([place])   . Hypertension     Past Surgical History:  Procedure Laterality Date  . CHOLECYSTECTOMY    . PARATHYROIDECTOMY  2015    Family History  Problem Relation Age of Onset  . Heart disease Mother     Social History   Socioeconomic History  . Marital status: Married    Spouse name: Not on file  . Number of children: Not on file  . Years of education: Not on file  . Highest education level: Not on file  Occupational History  . Occupation: retired  Tobacco Use  . Smoking status: Never Smoker  . Smokeless tobacco: Never Used  Substance and Sexual Activity  . Alcohol use: Yes    Alcohol/week: 1.0 standard drinks    Types: 1 Glasses of wine per week    Comment: Seldom  . Drug use: Never  . Sexual activity: Not Currently  Other Topics Concern  . Not on file  Social History Narrative  . Not on file   Social Determinants of  Health   Financial Resource Strain:   . Difficulty of Paying Living Expenses:   Food Insecurity:   . Worried About Charity fundraiser in the Last Year:   . Arboriculturist in the Last Year:   Transportation Needs:   . Film/video editor (Medical):   Marland Kitchen Lack of Transportation (Non-Medical):   Physical Activity:   . Days of Exercise per Week:   . Minutes of Exercise per Session:   Stress:   . Feeling of Stress :   Social Connections:   . Frequency of Communication with Friends and Family:   . Frequency of Social Gatherings with Friends and Family:   . Attends Religious Services:   . Active Member of Clubs or Organizations:   . Attends Archivist Meetings:   Marland Kitchen Marital Status:   Intimate Partner Violence:   . Fear of Current or Ex-Partner:   . Emotionally Abused:   Marland Kitchen Physically Abused:   . Sexually Abused:     Outpatient Medications Prior to Visit  Medication Sig Dispense Refill  . Blood Glucose Monitoring Suppl (ONE TOUCH ULTRA 2) w/Device KIT 1 kit by Does not apply route every morning. 1 kit 0  . carvedilol (COREG) 12.5 MG tablet Take 12.5 mg by mouth 2 (two) times daily with a meal.    . ferrous sulfate 324 (65 Fe) MG TBEC Take 1 tablet (325 mg total)  by mouth daily. 30 tablet 3  . glucose blood (ONETOUCH ULTRA) test strip Use as instructed 100 each 12  . lisinopril-hydrochlorothiazide (ZESTORETIC) 20-25 MG tablet Take 1 tablet by mouth daily.    . metFORMIN (GLUCOPHAGE) 500 MG tablet Take 1 tablet (500 mg total) by mouth 2 (two) times daily with a meal. 180 tablet 3   No facility-administered medications prior to visit.    No Known Allergies  ROS Review of Systems  Constitutional: Negative.   HENT: Negative.   Eyes: Negative.   Respiratory: Negative.   Cardiovascular: Negative.   Gastrointestinal: Negative.   Endocrine: Negative.   Genitourinary: Negative.   Skin: Negative.   Neurological: Negative.   Psychiatric/Behavioral: Negative.         Objective:    Physical Exam  Constitutional: She is oriented to person, place, and time. She appears well-developed and well-nourished.  HENT:  Head: Normocephalic.  Right Ear: External ear normal.  Left Ear: External ear normal.  Eyes: Pupils are equal, round, and reactive to light. Conjunctivae and EOM are normal.  Cardiovascular: Normal rate, regular rhythm and normal heart sounds.  Pulmonary/Chest: Effort normal and breath sounds normal.  Neurological: She is alert and oriented to person, place, and time. She has normal reflexes.  Vitals reviewed.  Diabetic Foot Exam - Simple   Simple Foot Form Diabetic Foot exam was performed with the following findings: Yes 05/17/2019 10:13 AM  Visual Inspection No deformities, no ulcerations, no other skin breakdown bilaterally: Yes Sensation Testing Intact to touch and monofilament testing bilaterally: Yes Pulse Check Posterior Tibialis and Dorsalis pulse intact bilaterally: Yes Comments   The patient has a history of falls. I complete a risk assessment for falls. A plan of care for falls was documented. BP 130/70 (BP Location: Left Arm, Patient Position: Sitting)   Pulse 74   Temp 97.8 F (36.6 C) (Temporal)   Resp 17   Ht 5' 7"  (1.702 m)   Wt 170 lb 9.6 oz (77.4 kg)   BMI 26.72 kg/m  Wt Readings from Last 3 Encounters:  05/17/19 170 lb 9.6 oz (77.4 kg)  04/20/19 174 lb 3.2 oz (79 kg)  03/30/19 174 lb 6.4 oz (79.1 kg)     Health Maintenance Due  Topic Date Due  . OPHTHALMOLOGY EXAM  Never done  . TETANUS/TDAP  Never done  . DEXA SCAN  Never done  . PNA vac Low Risk Adult (1 of 2 - PCV13) Never done    There are no preventive care reminders to display for this patient.  No results found for: TSH Lab Results  Component Value Date   WBC 7.3 03/30/2019   HGB 12.1 03/30/2019   HCT 40.6 03/30/2019   MCV 73 (L) 03/30/2019   PLT 194 03/30/2019   Lab Results  Component Value Date   NA 138 03/30/2019   K 4.3 03/30/2019    CO2 23 03/30/2019   GLUCOSE 270 (H) 03/30/2019   BUN 18 03/30/2019   CREATININE 0.90 03/30/2019   BILITOT 0.7 03/30/2019   ALKPHOS 98 03/30/2019   AST 14 03/30/2019   ALT 15 03/30/2019   PROT 6.8 03/30/2019   ALBUMIN 4.3 03/30/2019   CALCIUM 10.0 03/30/2019   No results found for: CHOL No results found for: HDL No results found for: LDLCALC No results found for: TRIG No results found for: [facility] Lab Results  Component Value Date   HGBA1C 9.6 (H) 03/30/2019      Assessment & Plan:  Problem List Items Addressed This Visit      Endocrine   Diabetes mellitus type 2 in obese [facility])    An individual care plan for diabetes was established and reinforced today.  The patient's status was assessed using clinical findings on exam, labs and diagnostic testing. Patient success at meeting goals based on disease specific evidence-based guidelines and found to be fair controlled. Medications were assessed and patient's understanding of the medical issues , including barriers were assessed. Recommend adherence to a diabetic diet, a graduated exercise program, HgbA1c level is checked quarterly, and urine microalbumin performed yearly .  Annual mono-filament sensation testing performed. Lower blood pressure and control hyperlipidemia is important. Get annual eye exams and annual flu shots and smoking cessation discussed.  Self management goals were discussed. We discussed increasing metformin to tid.  Follow up 2 months      Relevant Orders   POCT UA - Microalbumin (Completed)     Other   BMI 26.0-26.9,adult    AN INDIVIDUAL CARE PLAN was established and reinforced today.  The patient's status was assessed using clinical findings on exam, labs, and other diagnostic testing. Patient's success at meeting treatment goals based on disease specific evidence-bassed guidelines and found to be in good control. RECOMMENDATIONS include maintaining present medicines and treatment.         No  orders of the defined types were placed in this encounter.   Follow-up: Return in about 2 months (around 07/17/2019) for fasting.    Reinaldo Meeker, MD

## 2019-05-17 NOTE — Assessment & Plan Note (Signed)
AN INDIVIDUAL CARE PLAN was established and reinforced today.  The patient's status was assessed using clinical findings on exam, labs, and other diagnostic testing. Patient's success at meeting treatment goals based on disease specific evidence-bassed guidelines and found to be in good control. RECOMMENDATIONS include maintaining present medicines and treatment. 

## 2019-05-25 ENCOUNTER — Telehealth: Payer: Self-pay | Admitting: Legal Medicine

## 2019-05-25 NOTE — Progress Notes (Signed)
  Chronic Care Management   Note  05/25/2019 Name: Erin Webster MRN: [id number] DOB: 10-24-1934  Erin Webster is a 85 y.o. year old female who is a primary care patient of Abigail Miyamoto, MD. I reached out to Gaylan Gerold by phone today in response to a referral sent by Ms. Versie Starks PCP, Abigail Miyamoto, MD.   Ms. Molden was given information about Chronic Care Management services today including:  1. CCM service includes personalized support from designated clinical staff supervised by her physician, including individualized plan of care and coordination with other care providers 2. 24/7 contact phone numbers for assistance for urgent and routine care needs. 3. Service will only be billed when office clinical staff spend 20 minutes or more in a month to coordinate care. 4. Only one practitioner may furnish and bill the service in a calendar month. 5. The patient may stop CCM services at any time (effective at the end of the month) by phone call to the office staff.   Patient did not agree to services and wishes to consider information provided before deciding about enrollment in care management services.   Follow up plan:   Lynnae January Upstream Scheduler

## 2019-05-31 ENCOUNTER — Other Ambulatory Visit: Payer: Self-pay

## 2019-05-31 ENCOUNTER — Encounter: Payer: Self-pay | Admitting: Legal Medicine

## 2019-05-31 ENCOUNTER — Ambulatory Visit (INDEPENDENT_AMBULATORY_CARE_PROVIDER_SITE_OTHER): Payer: Medicare Other | Admitting: Legal Medicine

## 2019-05-31 VITALS — BP 126/80 | HR 62 | Temp 97.3°F | Resp 16 | Ht 67.0 in | Wt 173.8 lb

## 2019-05-31 DIAGNOSIS — E1169 Type 2 diabetes mellitus with other specified complication: Secondary | ICD-10-CM

## 2019-05-31 DIAGNOSIS — E669 Obesity, unspecified: Secondary | ICD-10-CM

## 2019-05-31 LAB — COMPREHENSIVE METABOLIC PANEL
ALT: 17 IU/L (ref 0–32)
AST: 18 IU/L (ref 0–40)
Albumin/Globulin Ratio: 2.2 (ref 1.2–2.2)
Albumin: 4.4 g/dL (ref 3.6–4.6)
Alkaline Phosphatase: 80 IU/L (ref 39–117)
BUN/Creatinine Ratio: 18 (ref 12–28)
BUN: 14 mg/dL (ref 8–27)
Bilirubin Total: 0.8 mg/dL (ref 0.0–1.2)
CO2: 27 mmol/L (ref 20–29)
Calcium: 10 mg/dL (ref 8.7–10.3)
Chloride: 99 mmol/L (ref 96–106)
Creatinine, Ser: 0.77 mg/dL (ref 0.57–1.00)
GFR calc Af Amer: 83 mL/min/{1.73_m2} (ref >59)
GFR calc non Af Amer: 72 mL/min/{1.73_m2} (ref >59)
Globulin, Total: 2 g/dL (ref 1.5–4.5)
Glucose: 183 mg/dL — ABNORMAL HIGH (ref 65–99)
Potassium: 4.4 mmol/L (ref 3.5–5.2)
Sodium: 139 mmol/L (ref 134–144)
Total Protein: 6.4 g/dL (ref 6.0–8.5)

## 2019-05-31 LAB — HEMOGLOBIN A1C
Est. average glucose Bld gHb Est-mCnc: 194 mg/dL
Hgb A1c MFr Bld: 8.4 % — ABNORMAL HIGH (ref 4.8–5.6)

## 2019-05-31 NOTE — Assessment & Plan Note (Signed)
An individual care plan for diabetes was established and reinforced today.  The patient's status was assessed using clinical findings on exam, labs and diagnostic testing. Patient success at meeting goals based on disease specific evidence-based guidelines and found to be improving controlled. Medications were assessed and patient's understanding of the medical issues , including barriers were assessed. Recommend adherence to a diabetic diet, a graduated exercise program, HgbA1c level is checked quarterly, and urine microalbumin performed yearly .  Annual mono-filament sensation testing performed. Lower blood pressure and control hyperlipidemia is important. Get annual eye exams and annual flu shots and smoking cessation discussed.  Self management goals were discussed. 

## 2019-05-31 NOTE — Progress Notes (Signed)
Established Patient Office Visit  Subjective:  Patient ID: Erin Webster, female    DOB: 1934/03/24  Age: 85 y.o. MRN: [id number]  CC:  Chief Complaint  Patient presents with  . Diabetes    HPI Erin Webster presents for follow up diabetes.  She is now on metformin 51m bid.  Her sugars are about 160 average.  No low sugars.  Continue on diet.  Patient present with type 2 diabetes.  Specifically, this is type 2, noninsulin requiring diabetes, complicated by hypertension and hypercholesterolemia.  Compliance with treatment has been good; patient take medicines as directed, maintains diet and exercise regimen, follows up as directed, and is keeping glucose diary.  Date of  diagnosis 2021.  Depression screen has been performed.Tobacco screen nonsmoker. Current medicines for diabetes metformin TID.  Patient is on lisinopril for renal protection and nothing for cholesterol control.  Patient performs foot exams daily and last ophthalmologic exam was this year.  Past Medical History:  Diagnosis Date  . Hyperparathyroidism ([place]   . Hypertension     Past Surgical History:  Procedure Laterality Date  . CHOLECYSTECTOMY    . PARATHYROIDECTOMY  2015    Family History  Problem Relation Age of Onset  . Heart disease Mother     Social History   Socioeconomic History  . Marital status: Married    Spouse name: Not on file  . Number of children: Not on file  . Years of education: Not on file  . Highest education level: Not on file  Occupational History  . Occupation: retired  Tobacco Use  . Smoking status: Never Smoker  . Smokeless tobacco: Never Used  Substance and Sexual Activity  . Alcohol use: Yes    Alcohol/week: 1.0 standard drinks    Types: 1 Glasses of wine per week    Comment: Seldom  . Drug use: Never  . Sexual activity: Not Currently  Other Topics Concern  . Not on file  Social History Narrative  . Not on file   Social Determinants of Health   Financial  Resource Strain:   . Difficulty of Paying Living Expenses:   Food Insecurity:   . Worried About RCharity fundraiserin the Last Year:   . RArboriculturistin the Last Year:   Transportation Needs:   . LFilm/video editor(Medical):   .Marland KitchenLack of Transportation (Non-Medical):   Physical Activity:   . Days of Exercise per Week:   . Minutes of Exercise per Session:   Stress:   . Feeling of Stress :   Social Connections:   . Frequency of Communication with Friends and Family:   . Frequency of Social Gatherings with Friends and Family:   . Attends Religious Services:   . Active Member of Clubs or Organizations:   . Attends CArchivistMeetings:   .Marland KitchenMarital Status:   Intimate Partner Violence:   . Fear of Current or Ex-Partner:   . Emotionally Abused:   .Marland KitchenPhysically Abused:   . Sexually Abused:     Outpatient Medications Prior to Visit  Medication Sig Dispense Refill  . Blood Glucose Monitoring Suppl (ONE TOUCH ULTRA 2) w/Device KIT 1 kit by Does not apply route every morning. 1 kit 0  . carvedilol (COREG) 12.5 MG tablet Take 12.5 mg by mouth 2 (two) times daily with a meal.    . ferrous sulfate 324 (65 Fe) MG TBEC Take 1 tablet (325 mg total) by mouth daily. 3[place]  tablet 3  . glucose blood (ONETOUCH ULTRA) test strip Use as instructed 100 each 12  . lisinopril-hydrochlorothiazide (ZESTORETIC) 20-25 MG tablet Take 1 tablet by mouth daily.    . metFORMIN (GLUCOPHAGE) 500 MG tablet Take 1 tablet (500 mg total) by mouth 2 (two) times daily with a meal. 180 tablet 3   No facility-administered medications prior to visit.    No Known Allergies  ROS Review of Systems  Constitutional: Negative.   HENT: Negative.   Eyes: Negative.   Respiratory: Negative.   Cardiovascular: Negative.   Gastrointestinal: Negative.   Endocrine: Negative.   Genitourinary: Negative.   Musculoskeletal: Negative.   Skin: Negative.   Neurological: Negative.   Psychiatric/Behavioral: Negative.        Objective:    Physical Exam  Constitutional: She is oriented to person, place, and time. She appears well-developed and well-nourished.  HENT:  Head: Normocephalic and atraumatic.  Right Ear: External ear normal.  Left Ear: External ear normal.  Eyes: Pupils are equal, round, and reactive to light. Conjunctivae and EOM are normal.  Cardiovascular: Normal rate, regular rhythm, normal heart sounds and intact distal pulses.  Pulmonary/Chest: Effort normal and breath sounds normal.  Abdominal: Soft. Bowel sounds are normal.  Musculoskeletal:        General: Normal range of motion.     Comments: Foot exam normal  Neurological: She is alert and oriented to person, place, and time. She has normal reflexes.  Vitals reviewed.   BP 126/80   Pulse 62   Temp (!) 97.3 F (36.3 C)   Resp 16   Ht 5' 7"  (1.702 m)   Wt 173 lb 12.8 oz (78.8 kg)   SpO2 96%   BMI 27.22 kg/m  Wt Readings from Last 3 Encounters:  05/31/19 173 lb 12.8 oz (78.8 kg)  05/17/19 170 lb 9.6 oz (77.4 kg)  04/20/19 174 lb 3.2 oz (79 kg)     Health Maintenance Due  Topic Date Due  . COVID-19 Vaccine (1) Never done  . TETANUS/TDAP  Never done  . DEXA SCAN  Never done  . PNA vac Low Risk Adult (1 of 2 - PCV13) Never done    There are no preventive care reminders to display for this patient.  No results found for: TSH Lab Results  Component Value Date   WBC 7.3 03/30/2019   HGB 12.1 03/30/2019   HCT 40.6 03/30/2019   MCV 73 (L) 03/30/2019   PLT 194 03/30/2019   Lab Results  Component Value Date   NA 138 03/30/2019   K 4.3 03/30/2019   CO2 23 03/30/2019   GLUCOSE 270 (H) 03/30/2019   BUN 18 03/30/2019   CREATININE 0.90 03/30/2019   BILITOT 0.7 03/30/2019   ALKPHOS 98 03/30/2019   AST 14 03/30/2019   ALT 15 03/30/2019   PROT 6.8 03/30/2019   ALBUMIN 4.3 03/30/2019   CALCIUM 10.0 03/30/2019   No results found for: CHOL No results found for: HDL No results found for: LDLCALC No results  found for: TRIG No results found for: CHOLHDL Lab Results  Component Value Date   HGBA1C 9.6 (H) 03/30/2019      Assessment & Plan:   Problem List Items Addressed This Visit      Endocrine   Diabetes mellitus type 2 in obese [facility]) - Primary    An individual care plan for diabetes was established and reinforced today.  The patient's status was assessed using clinical findings on exam, labs and  diagnostic testing. Patient success at meeting goals based on disease specific evidence-based guidelines and found to be improving controlled. Medications were assessed and patient's understanding of the medical issues , including barriers were assessed. Recommend adherence to a diabetic diet, a graduated exercise program, HgbA1c level is checked quarterly, and urine microalbumin performed yearly .  Annual mono-filament sensation testing performed. Lower blood pressure and control hyperlipidemia is important. Get annual eye exams and annual flu shots and smoking cessation discussed.  Self management goals were discussed.      Relevant Orders   Hemoglobin A1c   Comprehensive metabolic panel      No orders of the defined types were placed in this encounter.   Follow-up: Return in about 3 months (around 08/30/2019) for fasting.    Reinaldo Meeker, MD

## 2019-06-01 NOTE — Progress Notes (Signed)
A1c now 8.4 down from 9.6- we still want under 8, glucose high 183, Kidneys ok, liver tests ok,

## 2019-07-20 ENCOUNTER — Other Ambulatory Visit: Payer: Self-pay

## 2019-09-15 ENCOUNTER — Other Ambulatory Visit: Payer: Self-pay | Admitting: Legal Medicine

## 2019-09-27 ENCOUNTER — Ambulatory Visit: Payer: Medicare Other | Admitting: Legal Medicine

## 2019-12-22 ENCOUNTER — Other Ambulatory Visit: Payer: Self-pay | Admitting: Family Medicine

## 2019-12-22 DIAGNOSIS — M5412 Radiculopathy, cervical region: Secondary | ICD-10-CM

## 2020-01-17 ENCOUNTER — Ambulatory Visit
Admission: RE | Admit: 2020-01-17 | Discharge: 2020-01-17 | Disposition: A | Discharge status: Home / Self Care | Payer: Medicare Other | Source: Ambulatory Visit | Attending: Family Medicine | Admitting: Family Medicine

## 2020-01-17 ENCOUNTER — Other Ambulatory Visit: Payer: Self-pay

## 2020-01-17 DIAGNOSIS — M5412 Radiculopathy, cervical region: Secondary | ICD-10-CM

## 2020-05-22 ENCOUNTER — Other Ambulatory Visit: Payer: Self-pay | Admitting: Legal Medicine

## 2020-05-22 DIAGNOSIS — E210 Primary hyperparathyroidism: Secondary | ICD-10-CM

## 2020-06-20 ENCOUNTER — Other Ambulatory Visit: Payer: Self-pay | Admitting: Legal Medicine

## 2020-06-20 DIAGNOSIS — E669 Obesity, unspecified: Secondary | ICD-10-CM

## 2020-07-31 DIAGNOSIS — I10 Essential (primary) hypertension: Secondary | ICD-10-CM | POA: Diagnosis not present

## 2020-07-31 DIAGNOSIS — Z0000 Encounter for general adult medical examination without abnormal findings: Secondary | ICD-10-CM | POA: Diagnosis not present

## 2020-07-31 DIAGNOSIS — E1169 Type 2 diabetes mellitus with other specified complication: Secondary | ICD-10-CM | POA: Diagnosis not present

## 2020-07-31 DIAGNOSIS — Z6827 Body mass index (BMI) 27.0-27.9, adult: Secondary | ICD-10-CM | POA: Diagnosis not present

## 2021-01-29 DIAGNOSIS — E1169 Type 2 diabetes mellitus with other specified complication: Secondary | ICD-10-CM | POA: Diagnosis not present

## 2021-01-29 DIAGNOSIS — Z6828 Body mass index (BMI) 28.0-28.9, adult: Secondary | ICD-10-CM | POA: Diagnosis not present

## 2021-01-29 DIAGNOSIS — Z2821 Immunization not carried out because of patient refusal: Secondary | ICD-10-CM | POA: Diagnosis not present

## 2021-01-29 DIAGNOSIS — I10 Essential (primary) hypertension: Secondary | ICD-10-CM | POA: Diagnosis not present

## 2021-01-29 DIAGNOSIS — E210 Primary hyperparathyroidism: Secondary | ICD-10-CM | POA: Diagnosis not present

## 2021-03-13 DIAGNOSIS — Z973 Presence of spectacles and contact lenses: Secondary | ICD-10-CM | POA: Diagnosis not present

## 2021-03-13 DIAGNOSIS — H25813 Combined forms of age-related cataract, bilateral: Secondary | ICD-10-CM | POA: Diagnosis not present

## 2021-03-13 DIAGNOSIS — H527 Unspecified disorder of refraction: Secondary | ICD-10-CM | POA: Diagnosis not present

## 2021-03-13 DIAGNOSIS — E119 Type 2 diabetes mellitus without complications: Secondary | ICD-10-CM | POA: Diagnosis not present

## 2021-04-25 DIAGNOSIS — H524 Presbyopia: Secondary | ICD-10-CM | POA: Diagnosis not present

## 2021-04-25 DIAGNOSIS — H5203 Hypermetropia, bilateral: Secondary | ICD-10-CM | POA: Diagnosis not present

## 2021-04-25 DIAGNOSIS — H2513 Age-related nuclear cataract, bilateral: Secondary | ICD-10-CM | POA: Diagnosis not present

## 2021-04-25 DIAGNOSIS — H43812 Vitreous degeneration, left eye: Secondary | ICD-10-CM | POA: Diagnosis not present

## 2021-04-25 DIAGNOSIS — H35373 Puckering of macula, bilateral: Secondary | ICD-10-CM | POA: Diagnosis not present

## 2021-04-25 DIAGNOSIS — H52223 Regular astigmatism, bilateral: Secondary | ICD-10-CM | POA: Diagnosis not present

## 2021-04-25 DIAGNOSIS — E119 Type 2 diabetes mellitus without complications: Secondary | ICD-10-CM | POA: Diagnosis not present

## 2021-05-08 DIAGNOSIS — E1169 Type 2 diabetes mellitus with other specified complication: Secondary | ICD-10-CM | POA: Diagnosis not present

## 2021-05-08 DIAGNOSIS — I10 Essential (primary) hypertension: Secondary | ICD-10-CM | POA: Diagnosis not present

## 2021-05-21 DIAGNOSIS — Z6828 Body mass index (BMI) 28.0-28.9, adult: Secondary | ICD-10-CM | POA: Diagnosis not present

## 2021-05-21 DIAGNOSIS — E1169 Type 2 diabetes mellitus with other specified complication: Secondary | ICD-10-CM | POA: Diagnosis not present

## 2021-11-05 DIAGNOSIS — H43812 Vitreous degeneration, left eye: Secondary | ICD-10-CM | POA: Diagnosis not present

## 2021-11-05 DIAGNOSIS — H5203 Hypermetropia, bilateral: Secondary | ICD-10-CM | POA: Diagnosis not present

## 2021-11-05 DIAGNOSIS — H52223 Regular astigmatism, bilateral: Secondary | ICD-10-CM | POA: Diagnosis not present

## 2021-11-05 DIAGNOSIS — H524 Presbyopia: Secondary | ICD-10-CM | POA: Diagnosis not present

## 2021-11-05 DIAGNOSIS — H2513 Age-related nuclear cataract, bilateral: Secondary | ICD-10-CM | POA: Diagnosis not present

## 2021-11-05 DIAGNOSIS — E119 Type 2 diabetes mellitus without complications: Secondary | ICD-10-CM | POA: Diagnosis not present

## 2021-11-05 DIAGNOSIS — H35373 Puckering of macula, bilateral: Secondary | ICD-10-CM | POA: Diagnosis not present

## 2021-12-11 DIAGNOSIS — M47812 Spondylosis without myelopathy or radiculopathy, cervical region: Secondary | ICD-10-CM | POA: Diagnosis not present

## 2021-12-11 DIAGNOSIS — M7918 Myalgia, other site: Secondary | ICD-10-CM | POA: Diagnosis not present

## 2022-01-02 DIAGNOSIS — E119 Type 2 diabetes mellitus without complications: Secondary | ICD-10-CM | POA: Diagnosis not present

## 2022-01-02 DIAGNOSIS — H524 Presbyopia: Secondary | ICD-10-CM | POA: Diagnosis not present

## 2022-01-02 DIAGNOSIS — H25013 Cortical age-related cataract, bilateral: Secondary | ICD-10-CM | POA: Diagnosis not present

## 2022-01-02 DIAGNOSIS — H5203 Hypermetropia, bilateral: Secondary | ICD-10-CM | POA: Diagnosis not present

## 2022-01-02 DIAGNOSIS — H35373 Puckering of macula, bilateral: Secondary | ICD-10-CM | POA: Diagnosis not present

## 2022-01-02 DIAGNOSIS — H43812 Vitreous degeneration, left eye: Secondary | ICD-10-CM | POA: Diagnosis not present

## 2022-01-02 DIAGNOSIS — H2513 Age-related nuclear cataract, bilateral: Secondary | ICD-10-CM | POA: Diagnosis not present

## 2022-01-02 DIAGNOSIS — H52223 Regular astigmatism, bilateral: Secondary | ICD-10-CM | POA: Diagnosis not present

## 2022-01-12 ENCOUNTER — Other Ambulatory Visit: Payer: Self-pay | Admitting: Legal Medicine

## 2022-01-22 DIAGNOSIS — M47812 Spondylosis without myelopathy or radiculopathy, cervical region: Secondary | ICD-10-CM | POA: Diagnosis not present

## 2022-01-22 DIAGNOSIS — M7918 Myalgia, other site: Secondary | ICD-10-CM | POA: Diagnosis not present

## 2022-03-17 DIAGNOSIS — R3589 Other polyuria: Secondary | ICD-10-CM | POA: Diagnosis not present

## 2022-03-17 DIAGNOSIS — Z6826 Body mass index (BMI) 26.0-26.9, adult: Secondary | ICD-10-CM | POA: Diagnosis not present

## 2022-03-17 DIAGNOSIS — Z79899 Other long term (current) drug therapy: Secondary | ICD-10-CM | POA: Diagnosis not present

## 2022-03-17 DIAGNOSIS — Z0000 Encounter for general adult medical examination without abnormal findings: Secondary | ICD-10-CM | POA: Diagnosis not present

## 2022-03-17 DIAGNOSIS — E1169 Type 2 diabetes mellitus with other specified complication: Secondary | ICD-10-CM | POA: Diagnosis not present

## 2022-03-17 DIAGNOSIS — Z1331 Encounter for screening for depression: Secondary | ICD-10-CM | POA: Diagnosis not present

## 2022-04-03 DIAGNOSIS — N390 Urinary tract infection, site not specified: Secondary | ICD-10-CM | POA: Diagnosis not present

## 2022-04-22 DIAGNOSIS — Z1330 Encounter for screening examination for mental health and behavioral disorders, unspecified: Secondary | ICD-10-CM | POA: Diagnosis not present

## 2022-04-22 DIAGNOSIS — N898 Other specified noninflammatory disorders of vagina: Secondary | ICD-10-CM | POA: Diagnosis not present

## 2022-04-22 DIAGNOSIS — B3731 Acute candidiasis of vulva and vagina: Secondary | ICD-10-CM | POA: Diagnosis not present

## 2022-04-22 DIAGNOSIS — N952 Postmenopausal atrophic vaginitis: Secondary | ICD-10-CM | POA: Diagnosis not present

## 2022-04-22 DIAGNOSIS — R350 Frequency of micturition: Secondary | ICD-10-CM | POA: Diagnosis not present

## 2022-05-06 DIAGNOSIS — H2513 Age-related nuclear cataract, bilateral: Secondary | ICD-10-CM | POA: Diagnosis not present

## 2022-05-06 DIAGNOSIS — H35373 Puckering of macula, bilateral: Secondary | ICD-10-CM | POA: Diagnosis not present

## 2022-05-06 DIAGNOSIS — E119 Type 2 diabetes mellitus without complications: Secondary | ICD-10-CM | POA: Diagnosis not present

## 2022-05-06 DIAGNOSIS — H25013 Cortical age-related cataract, bilateral: Secondary | ICD-10-CM | POA: Diagnosis not present

## 2022-05-06 DIAGNOSIS — H43812 Vitreous degeneration, left eye: Secondary | ICD-10-CM | POA: Diagnosis not present

## 2022-06-18 DIAGNOSIS — N3281 Overactive bladder: Secondary | ICD-10-CM | POA: Diagnosis not present

## 2022-06-18 DIAGNOSIS — E1169 Type 2 diabetes mellitus with other specified complication: Secondary | ICD-10-CM | POA: Diagnosis not present

## 2022-06-18 DIAGNOSIS — I10 Essential (primary) hypertension: Secondary | ICD-10-CM | POA: Diagnosis not present

## 2022-06-18 DIAGNOSIS — Z6826 Body mass index (BMI) 26.0-26.9, adult: Secondary | ICD-10-CM | POA: Diagnosis not present

## 2022-06-30 DIAGNOSIS — N3941 Urge incontinence: Secondary | ICD-10-CM | POA: Diagnosis not present

## 2022-06-30 DIAGNOSIS — N3000 Acute cystitis without hematuria: Secondary | ICD-10-CM | POA: Diagnosis not present

## 2022-06-30 DIAGNOSIS — N952 Postmenopausal atrophic vaginitis: Secondary | ICD-10-CM | POA: Diagnosis not present

## 2022-07-17 DIAGNOSIS — H35373 Puckering of macula, bilateral: Secondary | ICD-10-CM | POA: Diagnosis not present

## 2022-07-17 DIAGNOSIS — H25013 Cortical age-related cataract, bilateral: Secondary | ICD-10-CM | POA: Diagnosis not present

## 2022-07-17 DIAGNOSIS — E119 Type 2 diabetes mellitus without complications: Secondary | ICD-10-CM | POA: Diagnosis not present

## 2022-07-17 DIAGNOSIS — H2513 Age-related nuclear cataract, bilateral: Secondary | ICD-10-CM | POA: Diagnosis not present

## 2022-07-17 DIAGNOSIS — H43812 Vitreous degeneration, left eye: Secondary | ICD-10-CM | POA: Diagnosis not present

## 2022-07-27 DIAGNOSIS — N3000 Acute cystitis without hematuria: Secondary | ICD-10-CM | POA: Diagnosis not present

## 2022-07-27 DIAGNOSIS — N3941 Urge incontinence: Secondary | ICD-10-CM | POA: Diagnosis not present

## 2022-07-30 DIAGNOSIS — M79672 Pain in left foot: Secondary | ICD-10-CM | POA: Diagnosis not present

## 2022-07-30 DIAGNOSIS — Z6826 Body mass index (BMI) 26.0-26.9, adult: Secondary | ICD-10-CM | POA: Diagnosis not present

## 2022-08-20 DIAGNOSIS — M2142 Flat foot [pes planus] (acquired), left foot: Secondary | ICD-10-CM | POA: Diagnosis not present

## 2022-08-20 DIAGNOSIS — M25872 Other specified joint disorders, left ankle and foot: Secondary | ICD-10-CM | POA: Diagnosis not present

## 2022-08-26 DIAGNOSIS — M65872 Other synovitis and tenosynovitis, left ankle and foot: Secondary | ICD-10-CM | POA: Diagnosis not present

## 2022-08-26 DIAGNOSIS — M25472 Effusion, left ankle: Secondary | ICD-10-CM | POA: Diagnosis not present

## 2022-08-26 DIAGNOSIS — M25572 Pain in left ankle and joints of left foot: Secondary | ICD-10-CM | POA: Diagnosis not present

## 2022-08-26 DIAGNOSIS — R600 Localized edema: Secondary | ICD-10-CM | POA: Diagnosis not present

## 2022-08-26 DIAGNOSIS — M659 Synovitis and tenosynovitis, unspecified: Secondary | ICD-10-CM | POA: Diagnosis not present

## 2022-08-26 DIAGNOSIS — M19072 Primary osteoarthritis, left ankle and foot: Secondary | ICD-10-CM | POA: Diagnosis not present

## 2022-09-29 DIAGNOSIS — M25872 Other specified joint disorders, left ankle and foot: Secondary | ICD-10-CM | POA: Diagnosis not present

## 2022-09-29 DIAGNOSIS — M19072 Primary osteoarthritis, left ankle and foot: Secondary | ICD-10-CM | POA: Diagnosis not present

## 2022-09-29 DIAGNOSIS — M2142 Flat foot [pes planus] (acquired), left foot: Secondary | ICD-10-CM | POA: Diagnosis not present

## 2022-10-06 DIAGNOSIS — E210 Primary hyperparathyroidism: Secondary | ICD-10-CM | POA: Diagnosis not present

## 2022-10-06 DIAGNOSIS — Z6826 Body mass index (BMI) 26.0-26.9, adult: Secondary | ICD-10-CM | POA: Diagnosis not present

## 2022-10-15 DIAGNOSIS — M19072 Primary osteoarthritis, left ankle and foot: Secondary | ICD-10-CM | POA: Diagnosis not present

## 2022-10-15 DIAGNOSIS — M2142 Flat foot [pes planus] (acquired), left foot: Secondary | ICD-10-CM | POA: Diagnosis not present

## 2022-10-19 DIAGNOSIS — H2513 Age-related nuclear cataract, bilateral: Secondary | ICD-10-CM | POA: Diagnosis not present

## 2022-10-19 DIAGNOSIS — H43812 Vitreous degeneration, left eye: Secondary | ICD-10-CM | POA: Diagnosis not present

## 2022-10-19 DIAGNOSIS — H25013 Cortical age-related cataract, bilateral: Secondary | ICD-10-CM | POA: Diagnosis not present

## 2022-10-19 DIAGNOSIS — E119 Type 2 diabetes mellitus without complications: Secondary | ICD-10-CM | POA: Diagnosis not present

## 2022-10-19 DIAGNOSIS — H524 Presbyopia: Secondary | ICD-10-CM | POA: Diagnosis not present

## 2022-10-19 DIAGNOSIS — H35373 Puckering of macula, bilateral: Secondary | ICD-10-CM | POA: Diagnosis not present

## 2022-10-19 DIAGNOSIS — H04123 Dry eye syndrome of bilateral lacrimal glands: Secondary | ICD-10-CM | POA: Diagnosis not present

## 2022-10-19 DIAGNOSIS — H5203 Hypermetropia, bilateral: Secondary | ICD-10-CM | POA: Diagnosis not present

## 2022-10-19 DIAGNOSIS — H52223 Regular astigmatism, bilateral: Secondary | ICD-10-CM | POA: Diagnosis not present

## 2022-11-07 IMAGING — MR MR CERVICAL SPINE W/O CM
4 of 5 series · 27 of 48 positions shown · non-contrast
Comparison: 08/13/2017

CLINICAL DATA: Dizziness, prior injury.

EXAM:
MRI CERVICAL SPINE WITHOUT CONTRAST
TECHNIQUE: Multiplanar, multisequence MR imaging of the cervical spine was
performed. No intravenous contrast was administered.

[Series 2: T2 · sagittal · 3.0mm · 0.66mm/px · 6 of 13 slices shown (1 of 2)]
[im 1/13]
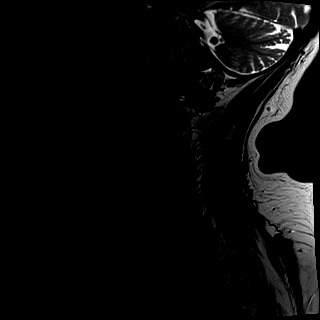
[im 3/13]
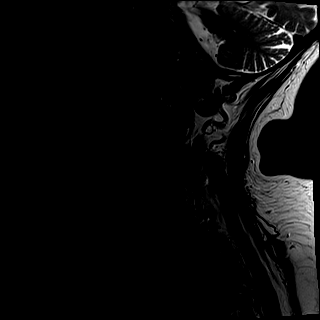
[im 5/13]
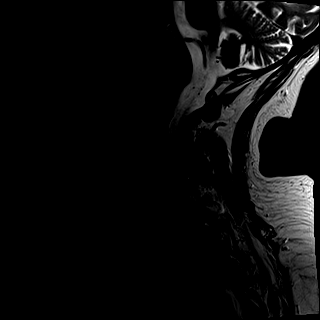
[im 8/13]
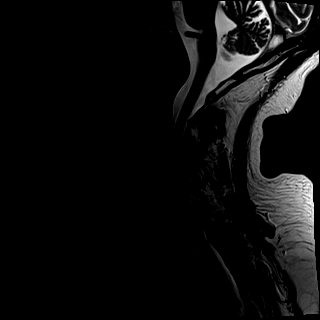
[im 10/13]
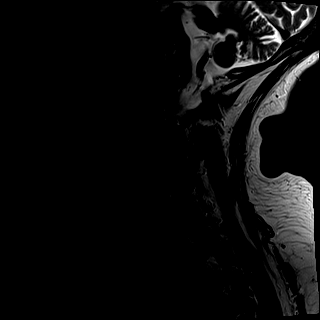
[im 13/13]
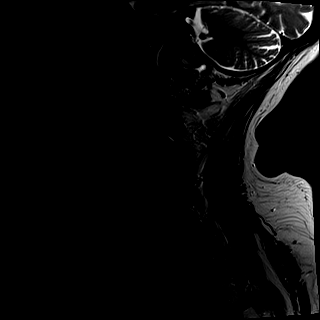

[Series 3: T1 · sagittal · 3.0mm · 0.41mm/px · 7 of 13 slices shown]
[im 1/13]
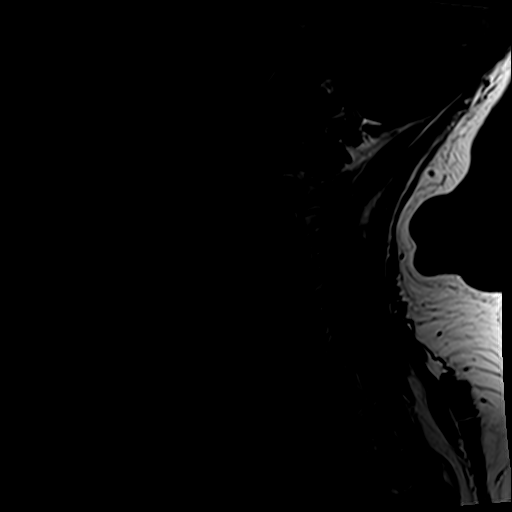
[im 3/13]
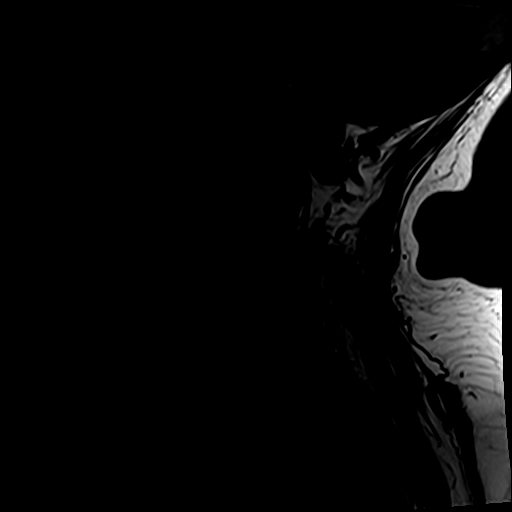
[im 5/13]
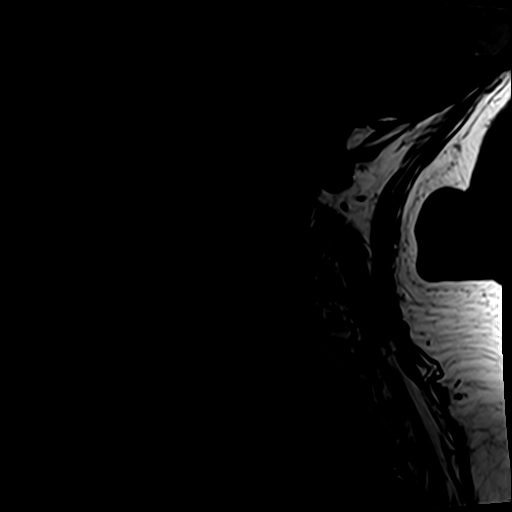
[im 7/13]
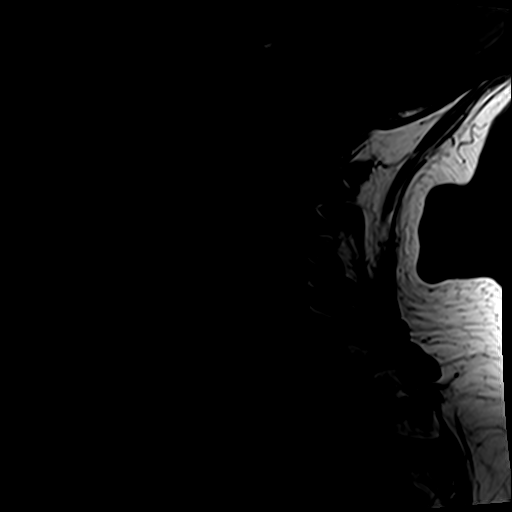
[im 9/13]
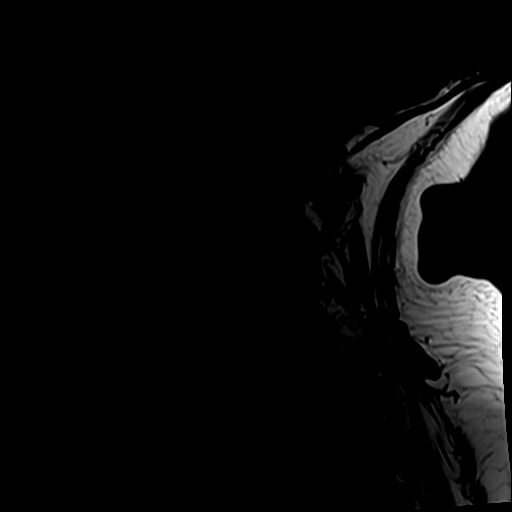
[im 11/13]
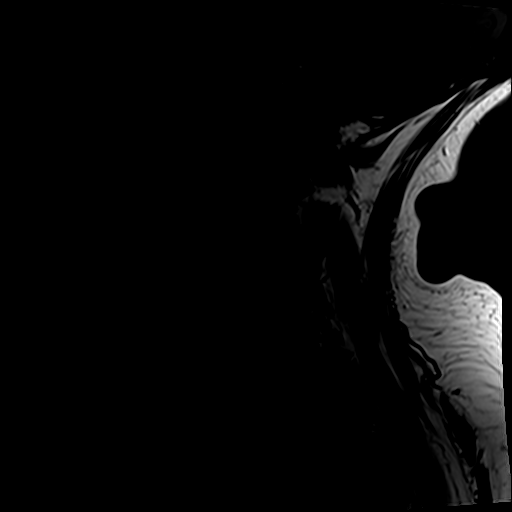
[im 13/13]
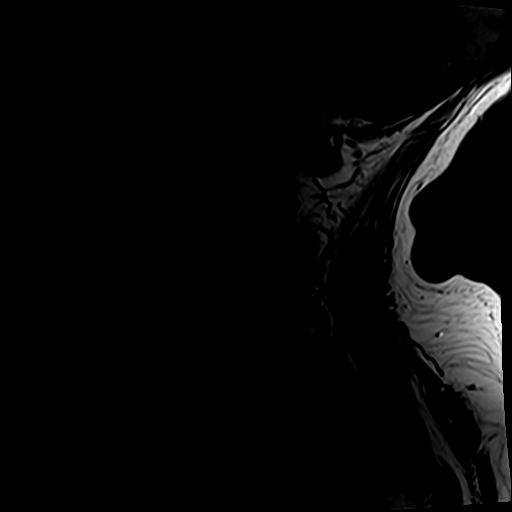

[Series 4: tir sag · sagittal · 3.0mm · 0.41mm/px · 6 of 13 slices shown]
[im 1/13]
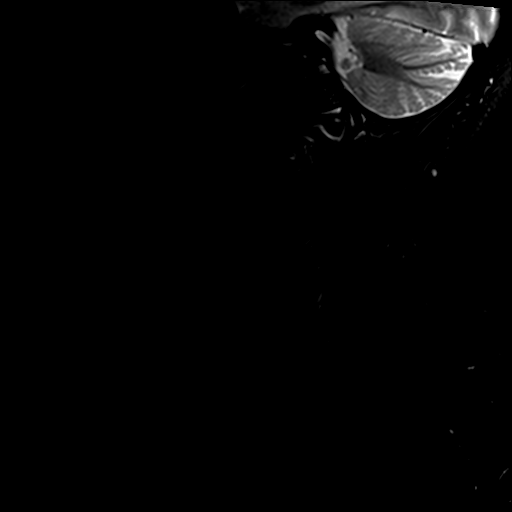
[im 3/13]
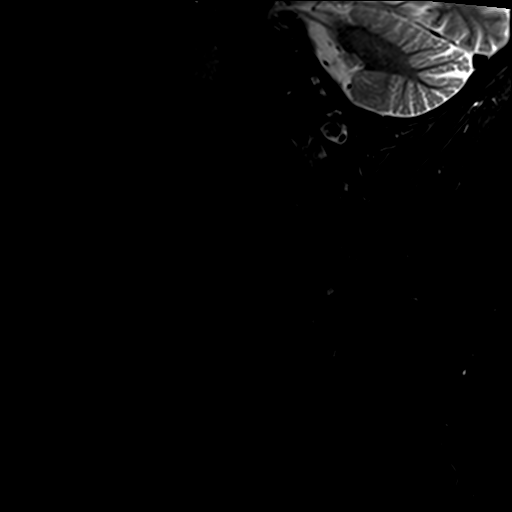
[im 5/13]
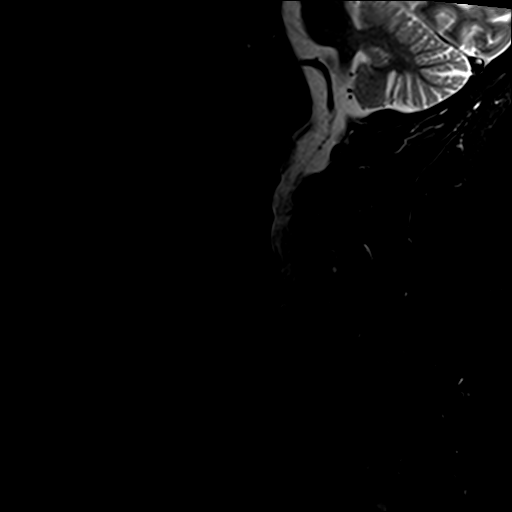
[im 7/13]
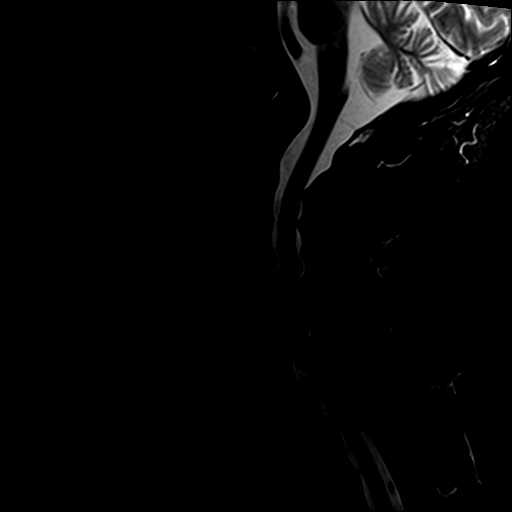
[im 9/13]
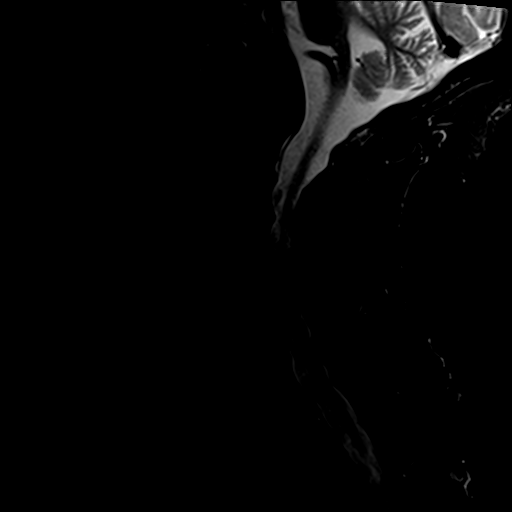
[im 11/13]
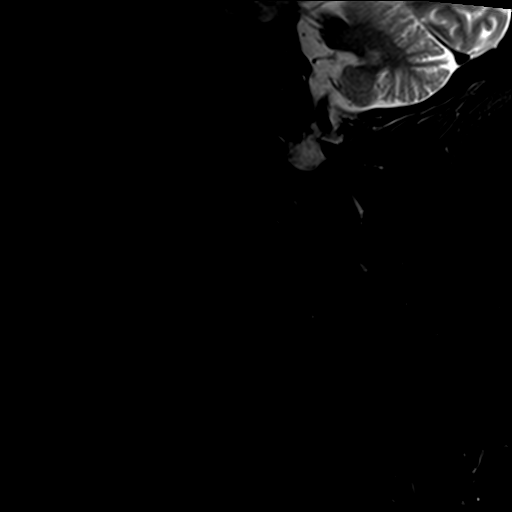

[Series 6: T2 · axial · 3.0mm · 0.70mm/px · z∈[-42,+56]mm · 8 of 28 slices shown (2 of 2)]
[im 1/28]
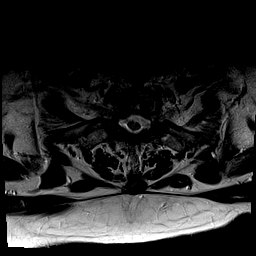
[im 5/28]
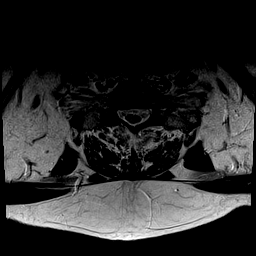
[im 9/28]
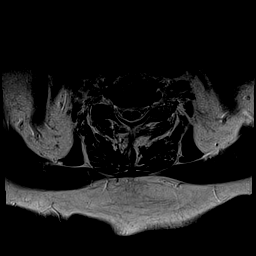
[im 13/28]
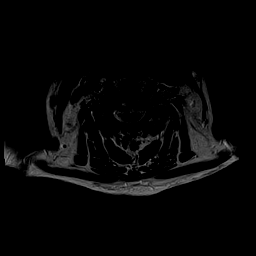
[im 15/28]
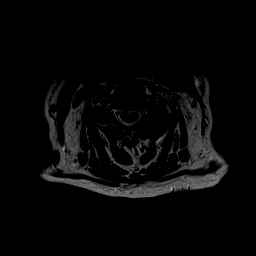
[im 19/28]
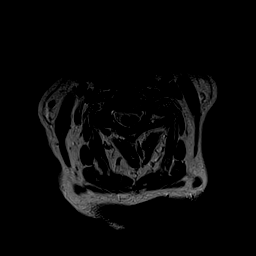
[im 23/28]
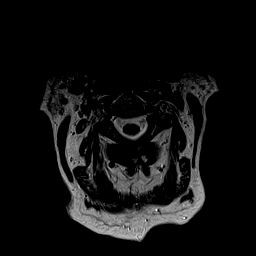
[im 28/28]
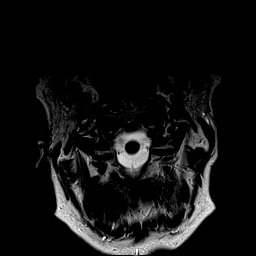

[27 of 48 positions shown; findings below may reference images not displayed]

FINDINGS: Alignment: Minimal grade 1 C5-6 and C6-7 retrolisthesis. Minimal
grade 1 C4-5, C7-T1 and T1-2 anterolisthesis.

Vertebrae: Vertebral body heights are preserved. Multilevel Modic
type 2 endplate degenerative changes.

Cord: Normal signal and morphology.

Posterior Fossa, vertebral arteries: Negative.

Disc levels: Multilevel desiccation and disc space loss.

C2-3: Uncovertebral and facet hypertrophy. No significant disc
bulge. Patent spinal canal and right neural foramen. Mild left
neural foraminal narrowing.

C3-4: Disc osteophyte complex with small left foraminal protrusion.
Uncovertebral and facet hypertrophy. Patent spinal canal. Mild right
and moderate left neural foraminal narrowing.

C4-5: Small disc osteophyte complex with uncovertebral and facet
hypertrophy. Prominent ligamentum flavum. Mild spinal canal, mild
right and moderate left neural foraminal narrowing.

C5-6: Disc osteophyte complex partially effacing the ventral CSF
containing spaces. Prominent ligamentum flavum. Uncovertebral and
facet hypertrophy. Mild spinal canal, severe right and moderate left
neural foraminal narrowing.

C6-7: Disc osteophyte complex with shallow central protrusion,
uncovertebral and facet hypertrophy. Prominent ligamentum flavum.
Mild spinal canal and bilateral neural foraminal narrowing.

C7-T1: Disc osteophyte complex with right predominant uncovertebral
and facet degenerative spurring. Prominent ligamentum flavum. Mild
spinal canal, moderate right and mild left neural foraminal
narrowing.

Paraspinal tissues: Negative.
IMPRESSION: Multilevel spondylosis. Mild spinal canal narrowing at the C4-T1
level.

Moderate to severe left C3-5, bilateral C5-6 and right C7-T1 neural
foraminal narrowing.

## 2022-12-11 DIAGNOSIS — H43812 Vitreous degeneration, left eye: Secondary | ICD-10-CM | POA: Diagnosis not present

## 2022-12-11 DIAGNOSIS — H25013 Cortical age-related cataract, bilateral: Secondary | ICD-10-CM | POA: Diagnosis not present

## 2022-12-11 DIAGNOSIS — H524 Presbyopia: Secondary | ICD-10-CM | POA: Diagnosis not present

## 2022-12-11 DIAGNOSIS — H2513 Age-related nuclear cataract, bilateral: Secondary | ICD-10-CM | POA: Diagnosis not present

## 2022-12-11 DIAGNOSIS — H04123 Dry eye syndrome of bilateral lacrimal glands: Secondary | ICD-10-CM | POA: Diagnosis not present

## 2022-12-11 DIAGNOSIS — H35373 Puckering of macula, bilateral: Secondary | ICD-10-CM | POA: Diagnosis not present

## 2023-01-18 DIAGNOSIS — N3941 Urge incontinence: Secondary | ICD-10-CM | POA: Diagnosis not present

## 2023-01-18 DIAGNOSIS — N3000 Acute cystitis without hematuria: Secondary | ICD-10-CM | POA: Diagnosis not present

## 2023-01-18 DIAGNOSIS — R8271 Bacteriuria: Secondary | ICD-10-CM | POA: Diagnosis not present

## 2023-02-01 DIAGNOSIS — N3941 Urge incontinence: Secondary | ICD-10-CM | POA: Diagnosis not present

## 2023-02-17 DIAGNOSIS — Z6826 Body mass index (BMI) 26.0-26.9, adult: Secondary | ICD-10-CM | POA: Diagnosis not present

## 2023-02-17 DIAGNOSIS — E1169 Type 2 diabetes mellitus with other specified complication: Secondary | ICD-10-CM | POA: Diagnosis not present

## 2023-03-01 DIAGNOSIS — N311 Reflex neuropathic bladder, not elsewhere classified: Secondary | ICD-10-CM | POA: Diagnosis not present

## 2023-03-01 DIAGNOSIS — N3946 Mixed incontinence: Secondary | ICD-10-CM | POA: Diagnosis not present

## 2023-03-01 DIAGNOSIS — N3642 Intrinsic sphincter deficiency (ISD): Secondary | ICD-10-CM | POA: Diagnosis not present

## 2023-03-01 DIAGNOSIS — N952 Postmenopausal atrophic vaginitis: Secondary | ICD-10-CM | POA: Diagnosis not present

## 2023-03-03 DIAGNOSIS — N952 Postmenopausal atrophic vaginitis: Secondary | ICD-10-CM | POA: Diagnosis not present

## 2023-03-03 DIAGNOSIS — N3946 Mixed incontinence: Secondary | ICD-10-CM | POA: Diagnosis not present

## 2023-03-10 ENCOUNTER — Other Ambulatory Visit: Payer: Self-pay | Admitting: Urology

## 2023-03-10 DIAGNOSIS — H2513 Age-related nuclear cataract, bilateral: Secondary | ICD-10-CM | POA: Diagnosis not present

## 2023-03-10 DIAGNOSIS — H04123 Dry eye syndrome of bilateral lacrimal glands: Secondary | ICD-10-CM | POA: Diagnosis not present

## 2023-03-10 DIAGNOSIS — H524 Presbyopia: Secondary | ICD-10-CM | POA: Diagnosis not present

## 2023-03-10 DIAGNOSIS — H25013 Cortical age-related cataract, bilateral: Secondary | ICD-10-CM | POA: Diagnosis not present

## 2023-03-10 DIAGNOSIS — H35373 Puckering of macula, bilateral: Secondary | ICD-10-CM | POA: Diagnosis not present

## 2023-03-10 DIAGNOSIS — H43812 Vitreous degeneration, left eye: Secondary | ICD-10-CM | POA: Diagnosis not present

## 2023-03-31 NOTE — Progress Notes (Signed)
 COVID Vaccine received:  []  No [x]  Yes Date of any COVID positive Test in last 90 days:  None  PCP -  Juleen China, MD  Cardiologist -   Chest x-ray -  EKG -  04-01-2023 Stress Test -  ECHO -  Cardiac Cath -   PCR screen: []  Ordered & Completed []   No Order but Needs PROFEND     [x]   N/A for this surgery  Surgery Plan:  [x]  Ambulatory   []  Outpatient in bed  []  Admit Anesthesia:    [x]  General  []  Spinal  []   Choice []   MAC  Pacemaker / ICD device [x]  No []  Yes   Spinal Cord Stimulator:[x]  No []  Yes       History of Sleep Apnea? [x]  No []  Yes   CPAP used?- [x]  No []  Yes    Does the patient monitor blood sugar?   []  N/A   [x]  No []  Yes  Patient has: []  NO Hx DM   []  Pre-DM   []  DM1  [x]   DM2 Last A1c was: 8.4  on  04-03-2019        Does patient have a [employer] or Dexcom? [x]  No []  Yes    Glipizide Day before take as usual, don't take DOS  Blood Thinner / Instructions:  none Aspirin Instructions:  none  ERAS Protocol Ordered: [x]  No  []  Yes Patient is to be NPO after: midnight prio  Dental hx: [x]  Dentures: full set of dentures []  N/A      []  Bridge or Partial:                   []  Loose or Damaged teeth:   Activity level: Patient is able climb a flight of stairs without difficulty; [x]  No CP  [x]  No SOB, but would have ___   Patient can perform ADLs without assistance.   Anesthesia review: DM2, HTN, anemia  Patient denies shortness of breath, fever, cough and chest pain at PAT appointment.  Patient verbalized understanding and agreement to the Pre-Surgical Instructions that were given to them at this PAT appointment. Patient was also educated of the need to review these PAT instructions again prior to her surgery.I reviewed the appropriate phone numbers to call if they have any and questions or concerns.

## 2023-03-31 NOTE — Patient Instructions (Signed)
 SURGICAL WAITING ROOM VISITATION Patients having surgery or a procedure may have no more than 2 support people in the waiting area - these visitors may rotate in the visitor waiting room.   Due to an increase in RSV and influenza rates and associated hospitalizations, children ages 54 and under may not visit patients in [facility] hospitals. If the patient needs to stay at the hospital during part of their recovery, the visitor guidelines for inpatient rooms apply.  PRE-OP VISITATION  Pre-op nurse will coordinate an appropriate time for 1 support person to accompany the patient in pre-op.  This support person may not rotate.  This visitor will be contacted when the time is appropriate for the visitor to come back in the pre-op area.  Please refer to the [facility] website for the visitor guidelines for Inpatients (after your surgery is over and you are in a regular room).  You are not required to quarantine at this time prior to your surgery. However, you must do this: Hand Hygiene often Do NOT share personal items Notify your provider if you are in close contact with someone who has COVID or you develop fever 100.4 or greater, new onset of sneezing, cough, sore throat, shortness of breath or body aches.  If you test positive for Covid or have been in contact with anyone that has tested positive in the last 10 days please notify you surgeon.    Your procedure is scheduled on:  THURSDAY  April 08, 2023  Report to [facility] Main Entrance: Leota Jacobsen entrance where the [employer] is available.   Report to admitting at: 07:45  AM  Call this number if you have any questions or problems the morning of surgery [phone]  DO NOT EAT OR DRINK ANYTHING AFTER MIDNIGHT THE NIGHT PRIOR TO YOUR SURGERY / PROCEDURE.   FOLLOW  ANY ADDITIONAL PRE OP INSTRUCTIONS YOU RECEIVED FROM YOUR SURGEON'S OFFICE!!!   Oral Hygiene is also important to reduce your risk of infection.        Remember -  BRUSH YOUR TEETH THE MORNING OF SURGERY WITH YOUR REGULAR TOOTHPASTE  Do NOT smoke after Midnight the night before surgery.  STOP TAKING all Vitamins, Herbs and supplements 1 week before your surgery.   Take ONLY these medicines the morning of surgery with A SIP OF WATER: carvedilol, and you may take Tylenol if needed.                   You may not have any metal on your body including hair pins, jewelry, and body piercing  Do not wear make-up, lotions, powders, perfumes or deodorant  Do not wear nail polish including gel and S&S, artificial / acrylic nails, or any other type of covering on natural nails including finger and toenails. If you have artificial nails, gel coating, etc., that needs to be removed by a nail salon, Please have this removed prior to surgery. Not doing so may mean that your surgery could be cancelled or delayed if the Surgeon or anesthesia staff feels like they are unable to monitor you safely.   Do not shave 48 hours prior to surgery to avoid nicks in your skin which may contribute to postoperative infections.   Contacts, Hearing Aids, dentures or bridgework may not be worn into surgery. DENTURES WILL BE REMOVED PRIOR TO SURGERY PLEASE DO NOT APPLY "Poly grip" OR ADHESIVES!!!  Patients discharged on the day of surgery will not be allowed to drive home.  Someone NEEDS to stay with you for the first 24 hours after anesthesia.  Do not bring your home medications to the hospital. The Pharmacy will dispense medications listed on your medication list to you during your admission in the Hospital.  Please read over the following fact sheets you were given: IF YOU HAVE QUESTIONS ABOUT YOUR PRE-OP INSTRUCTIONS, PLEASE CALL [phone]   KAY Karl Knarr, BSN, CVRN-BC.   [place] - Preparing for Surgery Before surgery, you can play an important role.  Because skin is not sterile, your skin needs to be as free of germs as possible.  You can reduce the number of germs on your  skin by washing with CHG (chlorahexidine gluconate) soap before surgery.  CHG is an antiseptic cleaner which kills germs and bonds with the skin to continue killing germs even after washing. Please DO NOT use if you have an allergy to CHG or antibacterial soaps.  If your skin becomes reddened/irritated stop using the CHG and inform your nurse when you arrive at Short Stay. Do not shave (including legs and underarms) for at least 48 hours prior to the first CHG shower.  You may shave your face/neck.  Please follow these instructions carefully:  1.  Shower with CHG Soap the night before surgery and the  morning of surgery.  2.  If you choose to wash your hair, wash your hair first as usual with your normal  shampoo.  3.  After you shampoo, rinse your hair and body thoroughly to remove the shampoo.                             4.  Use CHG as you would any other liquid soap.  You can apply chg directly to the skin and wash.  Gently with a scrungie or clean washcloth.  5.  Apply the CHG Soap to your body ONLY FROM THE NECK DOWN.   Do not use on face/ open                           Wound or open sores. Avoid contact with eyes, ears mouth and genitals (private parts).                       Wash face,  Genitals (private parts) with your normal soap.             6.  Wash thoroughly, paying special attention to the area where your  surgery  will be performed.  7.  Thoroughly rinse your body with warm water from the neck down.  8.  DO NOT shower/wash with your normal soap after using and rinsing off the CHG Soap.            9.  Pat yourself dry with a clean towel.            10.  Wear clean pajamas.            11.  Place clean sheets on your bed the night of your first shower and do not  sleep with pets.  ON THE DAY OF SURGERY : Do not apply any lotions/deodorants the morning of surgery.  Please wear clean clothes to the hospital/surgery center.    FAILURE TO FOLLOW THESE INSTRUCTIONS MAY RESULT IN THE  CANCELLATION OF YOUR SURGERY  PATIENT SIGNATURE_________________________________  NURSE SIGNATURE__________________________________  ________________________________________________________________________

## 2023-04-01 ENCOUNTER — Encounter (HOSPITAL_COMMUNITY): Payer: Self-pay | Admitting: *Deleted

## 2023-04-01 ENCOUNTER — Encounter (HOSPITAL_COMMUNITY)
Admission: RE | Admit: 2023-04-01 | Discharge: 2023-04-01 | Disposition: A | Discharge status: Home / Self Care | Payer: Medicare Other | Source: Ambulatory Visit | Attending: Urology | Admitting: Urology

## 2023-04-01 ENCOUNTER — Other Ambulatory Visit: Payer: Self-pay

## 2023-04-01 VITALS — BP 161/65 | HR 59 | Temp 98.1°F | Resp 14 | Ht 67.0 in | Wt 163.0 lb

## 2023-04-01 DIAGNOSIS — Z01818 Encounter for other preprocedural examination: Secondary | ICD-10-CM | POA: Insufficient documentation

## 2023-04-01 DIAGNOSIS — I10 Essential (primary) hypertension: Secondary | ICD-10-CM | POA: Diagnosis not present

## 2023-04-01 DIAGNOSIS — E669 Obesity, unspecified: Secondary | ICD-10-CM | POA: Diagnosis not present

## 2023-04-01 DIAGNOSIS — Z01812 Encounter for preprocedural laboratory examination: Secondary | ICD-10-CM | POA: Diagnosis present

## 2023-04-01 DIAGNOSIS — Z6825 Body mass index (BMI) 25.0-25.9, adult: Secondary | ICD-10-CM | POA: Insufficient documentation

## 2023-04-01 DIAGNOSIS — E1169 Type 2 diabetes mellitus with other specified complication: Secondary | ICD-10-CM | POA: Diagnosis not present

## 2023-04-01 DIAGNOSIS — Z01810 Encounter for preprocedural cardiovascular examination: Secondary | ICD-10-CM | POA: Diagnosis present

## 2023-04-01 HISTORY — DX: Type 2 diabetes mellitus without complications: E11.9

## 2023-04-01 HISTORY — DX: Personal history of urinary calculi: Z87.442

## 2023-04-01 HISTORY — DX: Unspecified osteoarthritis, unspecified site: M19.90

## 2023-04-01 LAB — CBC
HCT: 43.3 % (ref 36.0–46.0)
Hemoglobin: 13.4 g/dL (ref 12.0–15.0)
MCH: 24.4 pg — ABNORMAL LOW (ref 26.0–34.0)
MCHC: 30.9 g/dL (ref 30.0–36.0)
MCV: 78.7 fL — ABNORMAL LOW (ref 80.0–100.0)
Platelets: 181 10*3/uL (ref 150–400)
RBC: 5.5 MIL/uL — ABNORMAL HIGH (ref 3.87–5.11)
RDW: 15.7 % — ABNORMAL HIGH (ref 11.5–15.5)
WBC: 10 10*3/uL (ref 4.0–10.5)
nRBC: 0 % (ref 0.0–0.2)

## 2023-04-01 LAB — BASIC METABOLIC PANEL
Anion gap: 8 (ref 5–15)
BUN: 16 mg/dL (ref 8–23)
CO2: 31 mmol/L (ref 22–32)
Calcium: 9.7 mg/dL (ref 8.9–10.3)
Chloride: 97 mmol/L — ABNORMAL LOW (ref 98–111)
Creatinine, Ser: 0.68 mg/dL (ref 0.44–1.00)
GFR, Estimated: >60 mL/min (ref >60)
Glucose, Bld: 209 mg/dL — ABNORMAL HIGH (ref 70–99)
Potassium: 3.9 mmol/L (ref 3.5–5.1)
Sodium: 136 mmol/L (ref 135–145)

## 2023-04-01 LAB — HEMOGLOBIN A1C
Hgb A1c MFr Bld: 8.2 % — ABNORMAL HIGH (ref 4.8–5.6)
Mean Plasma Glucose: 188.64 mg/dL

## 2023-04-01 LAB — GLUCOSE, CAPILLARY: Glucose-Capillary: 219 mg/dL — ABNORMAL HIGH (ref 70–99)

## 2023-04-08 ENCOUNTER — Encounter (HOSPITAL_COMMUNITY): Payer: Self-pay | Admitting: Urology

## 2023-04-08 ENCOUNTER — Other Ambulatory Visit: Payer: Self-pay

## 2023-04-08 ENCOUNTER — Ambulatory Visit (HOSPITAL_BASED_OUTPATIENT_CLINIC_OR_DEPARTMENT_OTHER): Payer: Self-pay | Admitting: Certified Registered"

## 2023-04-08 ENCOUNTER — Encounter (HOSPITAL_COMMUNITY)
Admission: RE | Disposition: A | Discharge status: Home / Self Care | Payer: Self-pay | Source: Home / Self Care | Attending: Urology

## 2023-04-08 ENCOUNTER — Ambulatory Visit (HOSPITAL_COMMUNITY): Payer: Self-pay | Admitting: Physician Assistant

## 2023-04-08 ENCOUNTER — Ambulatory Visit (HOSPITAL_COMMUNITY)
Admission: RE | Admit: 2023-04-08 | Discharge: 2023-04-08 | Disposition: A | Discharge status: Home / Self Care | Payer: Medicare Other | Attending: Urology | Admitting: Urology

## 2023-04-08 DIAGNOSIS — E119 Type 2 diabetes mellitus without complications: Secondary | ICD-10-CM

## 2023-04-08 DIAGNOSIS — Z87440 Personal history of urinary (tract) infections: Secondary | ICD-10-CM | POA: Insufficient documentation

## 2023-04-08 DIAGNOSIS — E669 Obesity, unspecified: Secondary | ICD-10-CM

## 2023-04-08 DIAGNOSIS — Z7984 Long term (current) use of oral hypoglycemic drugs: Secondary | ICD-10-CM | POA: Insufficient documentation

## 2023-04-08 DIAGNOSIS — N952 Postmenopausal atrophic vaginitis: Secondary | ICD-10-CM | POA: Diagnosis not present

## 2023-04-08 DIAGNOSIS — Z79899 Other long term (current) drug therapy: Secondary | ICD-10-CM | POA: Diagnosis not present

## 2023-04-08 DIAGNOSIS — I10 Essential (primary) hypertension: Secondary | ICD-10-CM | POA: Diagnosis not present

## 2023-04-08 DIAGNOSIS — N3946 Mixed incontinence: Secondary | ICD-10-CM | POA: Insufficient documentation

## 2023-04-08 DIAGNOSIS — Z01818 Encounter for other preprocedural examination: Secondary | ICD-10-CM

## 2023-04-08 DIAGNOSIS — N393 Stress incontinence (female) (male): Secondary | ICD-10-CM

## 2023-04-08 HISTORY — PX: CYSTOSCOPY: SHX5120

## 2023-04-08 LAB — GLUCOSE, CAPILLARY
Glucose-Capillary: 173 mg/dL — ABNORMAL HIGH (ref 70–99)
Glucose-Capillary: 226 mg/dL — ABNORMAL HIGH (ref 70–99)
Glucose-Capillary: 194 mg/dL — ABNORMAL HIGH (ref 70–99)

## 2023-04-08 SURGERY — CYSTOSCOPY
Anesthesia: Monitor Anesthesia Care | Site: Urethra

## 2023-04-08 MED ORDER — ORAL CARE MOUTH RINSE: 15.0000 mL | Freq: Once | OROMUCOSAL | Status: DC

## 2023-04-08 MED ORDER — LACTATED RINGERS IV SOLN: INTRAVENOUS | Status: DC | PRN

## 2023-04-08 MED ORDER — INSULIN ASPART 100 UNIT/ML IJ SOLN
INTRAMUSCULAR | Status: AC
  Filled 2023-04-08: qty 1

## 2023-04-08 MED ORDER — FENTANYL CITRATE (PF) 100 MCG/2ML IJ SOLN
INTRAMUSCULAR | Status: AC
  Filled 2023-04-08: qty 2

## 2023-04-08 MED ORDER — CEFAZOLIN SODIUM-DEXTROSE 2-4 GM/100ML-% IV SOLN
2.0000 g | INTRAVENOUS | Status: AC
  Administered 2023-04-08: 2 g via INTRAVENOUS
  Filled 2023-04-08: qty 100

## 2023-04-08 MED ORDER — GLYCOPYRROLATE 0.2 MG/ML IJ SOLN
INTRAMUSCULAR | Status: AC
Start: 2023-04-08 — End: ?
  Filled 2023-04-08: qty 1

## 2023-04-08 MED ORDER — MEPERIDINE HCL 50 MG/ML IJ SOLN: 6.2500 mg | INTRAMUSCULAR | Status: DC | PRN

## 2023-04-08 MED ORDER — PROPOFOL 10 MG/ML IV BOLUS
INTRAVENOUS | Status: AC
  Filled 2023-04-08: qty 20

## 2023-04-08 MED ORDER — OXYCODONE HCL 5 MG PO TABS: 5.0000 mg | ORAL_TABLET | Freq: Once | ORAL | Status: DC | PRN

## 2023-04-08 MED ORDER — SODIUM CHLORIDE 0.9 % IR SOLN
Status: DC | PRN
  Administered 2023-04-08: 3000 mL

## 2023-04-08 MED ORDER — LACTATED RINGERS IV SOLN: INTRAVENOUS | Status: DC

## 2023-04-08 MED ORDER — FENTANYL CITRATE PF 50 MCG/ML IJ SOSY: 25.0000 ug | PREFILLED_SYRINGE | INTRAMUSCULAR | Status: DC | PRN

## 2023-04-08 MED ORDER — CHLORHEXIDINE GLUCONATE 0.12 % MT SOLN: 15.0000 mL | Freq: Once | OROMUCOSAL | Status: DC

## 2023-04-08 MED ORDER — ONDANSETRON HCL 4 MG/2ML IJ SOLN
INTRAMUSCULAR | Status: AC
Start: 2023-04-08 — End: ?
  Filled 2023-04-08: qty 2

## 2023-04-08 MED ORDER — LIDOCAINE HCL (PF) 2 % IJ SOLN
INTRAMUSCULAR | Status: DC | PRN
Start: 2023-04-08 — End: 2023-04-08
  Administered 2023-04-08: 100 mg via INTRADERMAL

## 2023-04-08 MED ORDER — LIDOCAINE HCL (PF) 2 % IJ SOLN
INTRAMUSCULAR | Status: AC
  Filled 2023-04-08: qty 5

## 2023-04-08 MED ORDER — INSULIN ASPART 100 UNIT/ML IJ SOLN
0.0000 [IU] | INTRAMUSCULAR | Status: DC | PRN
  Administered 2023-04-08: 3 [IU] via SUBCUTANEOUS

## 2023-04-08 MED ORDER — PROPOFOL 500 MG/50ML IV EMUL
INTRAVENOUS | Status: DC | PRN
  Administered 2023-04-08: 60 ug/kg/min via INTRAVENOUS

## 2023-04-08 MED ORDER — DEXMEDETOMIDINE HCL IN NACL 80 MCG/20ML IV SOLN
INTRAVENOUS | Status: AC
  Filled 2023-04-08: qty 20

## 2023-04-08 MED ORDER — MIDAZOLAM HCL 2 MG/2ML IJ SOLN: 0.5000 mg | Freq: Once | INTRAMUSCULAR | Status: DC | PRN

## 2023-04-08 MED ORDER — DEXMEDETOMIDINE HCL IN NACL 80 MCG/20ML IV SOLN
INTRAVENOUS | Status: DC | PRN
  Administered 2023-04-08 (×2): 4 ug via INTRAVENOUS

## 2023-04-08 MED ORDER — PROPOFOL 1000 MG/100ML IV EMUL
INTRAVENOUS | Status: AC
  Filled 2023-04-08: qty 100

## 2023-04-08 MED ORDER — ONDANSETRON HCL 4 MG/2ML IJ SOLN
INTRAMUSCULAR | Status: DC | PRN
  Administered 2023-04-08: 4 mg via INTRAVENOUS

## 2023-04-08 MED ORDER — PHENYLEPHRINE 80 MCG/ML (10ML) SYRINGE FOR IV PUSH (FOR BLOOD PRESSURE SUPPORT)
PREFILLED_SYRINGE | INTRAVENOUS | Status: AC
Start: 2023-04-08 — End: ?
  Filled 2023-04-08: qty 10

## 2023-04-08 MED ORDER — ACETAMINOPHEN 500 MG PO TABS
1000.0000 mg | ORAL_TABLET | Freq: Once | ORAL | Status: AC
  Administered 2023-04-08: 1000 mg via ORAL
  Filled 2023-04-08: qty 2

## 2023-04-08 MED ORDER — FENTANYL CITRATE (PF) 100 MCG/2ML IJ SOLN
INTRAMUSCULAR | Status: DC | PRN
  Administered 2023-04-08 (×2): 25 ug via INTRAVENOUS

## 2023-04-08 MED ORDER — IOHEXOL 300 MG/ML  SOLN: INTRAMUSCULAR | Status: DC | PRN

## 2023-04-08 MED ORDER — OXYCODONE HCL 5 MG/5ML PO SOLN: 5.0000 mg | Freq: Once | ORAL | Status: DC | PRN

## 2023-04-08 SURGICAL SUPPLY — 13 items
BAG URO CATCHER STRL LF (MISCELLANEOUS) ×1 IMPLANT
CATH URETL OPEN END 6FR 70 (CATHETERS) ×1 IMPLANT
CLOTH BEACON ORANGE TIMEOUT ST (SAFETY) ×1 IMPLANT
GLOVE BIO SURGEON STRL SZ 6.5 (GLOVE) ×1 IMPLANT
GOWN STRL REUS W/ TWL LRG LVL3 (GOWN DISPOSABLE) ×1 IMPLANT
GUIDEWIRE STR DUAL SENSOR (WIRE) ×1 IMPLANT
KIT TURNOVER KIT A (KITS) ×1 IMPLANT
MANIFOLD NEPTUNE II (INSTRUMENTS) ×1 IMPLANT
PACK CYSTO (CUSTOM PROCEDURE TRAY) ×1 IMPLANT
SYSTEM URETHRAL BULK BULKAMID (Female Continence) IMPLANT
TUBING CONNECTING 10 (TUBING) ×1 IMPLANT
TUBING UROLOGY SET (TUBING) IMPLANT
WATER STERILE IRR 3000ML UROMA (IV SOLUTION) ×1 IMPLANT

## 2023-04-08 NOTE — Anesthesia Postprocedure Evaluation (Signed)
 Anesthesia Post Note  Patient: Erin Webster  Procedure(s) Performed: CYSTOSCOPY (Urethra) INJECTION, BULKING AGENT, URETHRA (Urethra)     Patient location during evaluation: Other (telephone follow up) Anesthesia Type: MAC Level of consciousness: awake and alert, patient cooperative and oriented Pain management: pain level controlled Respiratory status: spontaneous breathing Cardiovascular status: stable Postop Assessment: no apparent nausea or vomiting, adequate PO intake and able to ambulate Anesthetic complications: no Comments: Pt received insulin in PACU (has not had insulin previously)  Follow-up telephone call: pt feels fine, is able to eat, blood sugar has not dropped. She is recovering well.    No notable events documented.  Last Vitals:  Vitals:   04/08/23 1045 04/08/23 1130  BP: (!) 143/72 136/67  Pulse:  (!) 52  Resp:    Temp:  36.7 C  SpO2:  100%    Last Pain:  Vitals:   04/08/23 1130  TempSrc:   PainSc: 0-No pain                 Sam Wunschel,E. Katheryne Gorr

## 2023-04-08 NOTE — Interval H&P Note (Signed)
 History and Physical Interval Note:  04/08/2023 8:23 AM  Erin Webster  has presented today for surgery, with the diagnosis of STRESS URINARY INCONTINENCE.  The various methods of treatment have been discussed with the patient and family. After consideration of risks, benefits and other options for treatment, the patient has consented to  Procedure(s) with comments: CYSTOSCOPY (N/A) - 30 MINUTES NEEDED INJECTION, BULKING AGENT, URETHRA (N/A) as a surgical intervention.  The patient's history has been reviewed, patient examined, no change in status, stable for surgery.  I have reviewed the patient's chart and labs.  Questions were answered to the patient's satisfaction.     Malory Spurr D Lamiya Naas

## 2023-04-08 NOTE — Addendum Note (Signed)
 Addendum  created 04/08/23 1323 by Jairo Ben, MD   Clinical Note Signed

## 2023-04-08 NOTE — Op Note (Signed)
 Operative Note   Preoperative diagnosis:  1.  Stress urinary incontinence   Postoperative diagnosis: 1.  Stress urinary incontinence   Procedure(s): 1.  Cystoscopy with injection of bulkamid   Surgeon: Kasandra Knudsen, MD   Assistants:  None   Anesthesia:  General   Complications:  None   EBL:  minimal   Specimens: 1. none   Drains/Catheters: 1.  none   Intraoperative findings:   Normal urethra   Indication:  89 yo woman with symptomatic stress urinary incontinence.   Description of procedure:   After risks and benefits of the procedure discussed with the patient, informed consent was obtained.  The patient was taken to the operating placed in the supine position.  Anesthesia was induced and antibiotics were administered.  The patient was then repositioned in the dorsolithotomy position.  She was prepped and draped in usual sterile fashion a timeout performed with the attending present.  The cystoscope was assembled with the Bulkamid system.  It was then placed in the urethral meatus and advanced into the bladder under direct visualization.  Prior cystoscopy had been done which noted normal anatomic landmarks.  These were again verified during cystoscopy today.  The cystoscope was brought back to the bladder neck and the needle was advanced through the needle guide at the 1 o'clock position.  Once it was visualized and advanced it was rotated to the 5 o'clock position.  Bulkamid was then injected until bleb was seen.  This was then repeated at the 1 o'clock position in the 7 o'clock position until coaptation was noted.   This concluded the case.  The patient's bladder was left with approximately 200 cc of sterile saline.  The patient emerged from anesthesia and was transferred the PACU in stable condition.   Plan:  Plan for patient to void in PACU prior to discharge.

## 2023-04-08 NOTE — Transfer of Care (Signed)
 Immediate Anesthesia Transfer of Care Note  Patient: Erin Webster  Procedure(s) Performed: CYSTOSCOPY (Urethra) INJECTION, BULKING AGENT, URETHRA (Urethra)  Patient Location: PACU  Anesthesia Type:MAC  Level of Consciousness: awake, alert , and oriented  Airway & Oxygen Therapy: Patient Spontanous Breathing and Patient connected to face mask oxygen  Post-op Assessment: Report given to RN and Post -op Vital signs reviewed and stable  Post vital signs: stable  Last Vitals:  Vitals Value Taken Time  BP 105/52 04/08/23 0937  Temp    Pulse 52 04/08/23 0941  Resp 8 04/08/23 0941  SpO2 100 % 04/08/23 0941  Vitals shown include unfiled device data.  Last Pain:  Vitals:   04/08/23 0825  TempSrc:   PainSc: 0-No pain         Complications: No notable events documented.

## 2023-04-08 NOTE — H&P (Signed)
 CC/HPI: 06/30/22: Erin Webster is an 89 yo female who had the onset of voiding symptoms in January. She took OTC meds but had persistent symptoms. She then saw her PCP and got a course of antibiotics but then was given a second based on the culture. She has continued to have some fatigue and loss of energy. She had no recent prior infections. She has had stones 8-10 years ago associated with hyperparathyroidism. She passed her stones and had a parathyroidectomy and has had no trouble since. She has had no GU surgery. She is currently on a probiotic and she is on oxybutynin 5mg  daily which she started last week and that has helped her urgency. Her voiding symptoms have been urgency with frequent small voids. She had nocturia x 1-2. She wasn't sure she was emptying. She had UUI but has no SUI. She had itching because of the wetness. She was given a powder for that. Her PVR is 0ml.   Her cultures grew e. coli in 2/13, Mx species on 3/1 and e. coli on 06/18/22 but with a different resistance pattern.   She has diabetes and glucosuria but no neuropathy.   07/27/2022: 89 year old female who was started on Vagifem by Dr. Annabell Howells at last office visit presents today for follow-up. She reports that her symptoms are greatly improved using Vagifem. She is also doing well on oxybutynin. She is currently on a probiotic as well as a cranberry tablet.   01/18/2023: 89 year old female who presents today for follow-up of urinary urgency and frequency and urinary tract infections. She is on Vagifem and doing well with this. She continues oxybutynin but does not feel it is meeting her treatment goals. She continues to have urgency associated with mild urge incontinence. She is also going somewhat frequently and reports going about every 2 hours. She will get up at night once or twice. She is very interested in refractory therapies instead of medications.   1/27/25[name] returns today in f/u. The urodynamics demonstrated a small  capacity bladder with instability and a low VLPP. The instability was triggered by cough. She has had UTI's but her UA is clear today. She is off of the Oxybutynin.    UDS SUMMARY  Ms. Erin Webster held a max capacity of approx. 216 mls. There was positive SUI. She leaked mildly with coughing and Valsalva. Please see information above. There was positive instability. Multiple consecutive unstable contraction contractions noted on the study. Some of these contractions were provoked from coughing. She felt a strong urgency and leaked mild to moderately. She was able to generate a voluntary contraction and void 140 mls with max flow of 15 ml/s. Max detrusor pressure while voiding was 21 cmH20. EMG leads were basically quiet during the voiding phase. PVR was approx. 24 mls. During cough and Valsalva, the bladder descended approximately 0-1 cm. No trabeculation was noted. No reflux was seen. She will return for UDS follow up   03/03/2023: 89 year old woman with urinary urgency and mixed urinary incontinence here to discuss management. She has tried medications but really wishes to not be on medication long-term. It seems she is most bothered by urinary urgency.     ALLERGIES: Metformin    MEDICATIONS: Gemtesa 75 mg tablet 1 tablet PO Daily  Lisinopril 20 mg tablet  Carvedilol 12.5 mg tablet  Clotrimazole 1 % cream  Glipizide 2.5 mg tablet  Multi Vitamin  Potassium 99 mg capsule  Vagifem 10 mcg tablet 1 tablet vaginal twice weekly  GU PSH: Complex cystometrogram, w/ void pressure and urethral pressure profile studies, any technique - 02/01/2023 Complex Uroflow - 02/01/2023 Emg surf Electrd - 02/01/2023 Hysterectomy Inject For cystogram - 02/01/2023 Intrabd voidng Press - 02/01/2023     NON-GU PSH: Cholecystectomy (laparoscopic) Parathyroidectomy Visit Complexity (formerly GPC1X) - 03/01/2023, 01/18/2023     GU PMH: Detrusor overactivity - 03/01/2023 Intrinsic Sphincter Dysfunction, She  has a low VLPP and CLPP. - 03/01/2023 Mixed incontinence, She has DI with a small capacity bladder with MUI and a low VLPP. I have given her Gemtesa samples to try while I get her set up to see Dr. Arita Miss for consideration of Bulkamid and possible botox. Gemtesa samples and instructions reviewed. - 03/01/2023 Personal Hx Urinary Tract Infections, Her UA is clear today. - 03/01/2023 Postmenopausal atrophic vaginitis, she will continue the vagifem. - 03/01/2023, I am going to start her on vagifem tablets to improve her vaginal health. Side effects reviewed. I explained to her that this will help reduce the risk of UTI's. , - 06/30/2022 Urge incontinence - 02/01/2023, - 01/18/2023, - 07/27/2022, She is improved with low dose oxybutynin and has had no side effects. , - 06/30/2022 Acute Cystitis/UTI - 01/18/2023, - 07/27/2022, She has had e. coli x 2 but hasn't taken the last antibiotics. her urgency and UUI are improved with oyxybutynin. She is reluctant to take the antibiotics so I have recommended she push fluids and we can reassess with the culture from today. If it is positive, I will recommend she take the med. She will return in 4-6 weeks. , - 06/30/2022    NON-GU PMH: Hypertension Other iron deficiency anemias Primary hyperparathyroidism Type 2 diabetes mellitus with other specified complication    FAMILY HISTORY: No Family History    SOCIAL HISTORY: Marital Status: Married Current Smoking Status: Patient has never smoked.   Tobacco Use Assessment Completed: Used Tobacco in last 30 days? Does not use smokeless tobacco. Social Drinker.  Does not use drugs. Has not had a blood transfusion.    REVIEW OF SYSTEMS:    GU Review Female:   Patient denies frequent urination, hard to postpone urination, burning /pain with urination, get up at night to urinate, leakage of urine, stream starts and stops, trouble starting your stream, have to strain to urinate, and being pregnant.  Gastrointestinal (Upper):    Patient denies nausea, vomiting, and indigestion/ heartburn.  Gastrointestinal (Lower):   Patient denies diarrhea and constipation.  Constitutional:   Patient denies fever, night sweats, weight loss, and fatigue.  Skin:   Patient denies skin rash/ lesion and itching.  Eyes:   Patient denies blurred vision and double vision.  Ears/ Nose/ Throat:   Patient denies sore throat and sinus problems.  Hematologic/Lymphatic:   Patient denies swollen glands and easy bruising.  Cardiovascular:   Patient denies leg swelling and chest pains.  Respiratory:   Patient denies cough and shortness of breath.  Endocrine:   Patient denies excessive thirst.  Musculoskeletal:   Patient denies back pain and joint pain.  Neurological:   Patient denies headaches and dizziness.  Psychologic:   Patient denies depression and anxiety.   VITAL SIGNS: None   MULTI-SYSTEM PHYSICAL EXAMINATION:    Constitutional: Well-nourished. No physical deformities. Normally developed. Good grooming.  Neck: Neck symmetrical, not swollen. Normal tracheal position.  Respiratory: No labored breathing, no use of accessory muscles.   Skin: No paleness, no jaundice, no cyanosis. No lesion, no ulcer, no rash.  Neurologic / Psychiatric: Oriented to time,  oriented to place, oriented to person. No depression, no anxiety, no agitation.  Eyes: Normal conjunctivae. Normal eyelids.  Ears, Nose, Mouth, and Throat: Left ear no scars, no lesions, no masses. Right ear no scars, no lesions, no masses. Nose no scars, no lesions, no masses. Normal hearing. Normal lips.  Musculoskeletal: Normal gait and station of head and neck.     Complexity of Data:  Records Review:   Previous Patient Records, POC Tool  Urine Test Review:   Urinalysis  Urodynamics Review:   Review Urodynamics Tests  Notes:                     06/18/2022: BUN 16, creatinine 0.9   PROCEDURES:          Urinalysis Dipstick Dipstick Cont'd  Color: Yellow Bilirubin: Neg mg/dL   Appearance: Clear Ketones: Neg mg/dL  Specific Gravity: 4.098 Blood: Neg ery/uL  pH: 5.5 Protein: Trace mg/dL  Glucose: 3+ mg/dL Urobilinogen: 0.2 mg/dL    Nitrites: Neg    Leukocyte Esterase: Neg leu/uL    ASSESSMENT:      ICD-10 Details  1 GU:   Postmenopausal atrophic vaginitis - N95.2 Chronic, Stable  2   Mixed incontinence - N39.46 Chronic, Stable   PLAN:           Document Letter(s):  Created for Patient: Clinical Summary         Notes:   Mixed urinary incontinence:  -I discussed Bulkamid for SUI with patient including risks and benefits. These include but are not limited to bleeding, infection, pain, failure to relieve symptoms, need for additional treatment, urinary retention. Urinary retention has been limited to 24 hours typically but may last longer. She understand that she may be discharged with a Foley catheter if this happens.   -I also discussed the role of Botox and that I do think it would help her urgency and urge incontinence. There is a higher chance of urinary retention with this and she is unsure whether or not she wants to go through with it. She wishes to avoid a long-term medication for urgency and urge incontinence.   -She would like to start with Bulkamid and see how she does. She will be scheduled for the next available date with sedation.

## 2023-04-08 NOTE — Discharge Instructions (Addendum)
 Cystoscopy with Bulkamid patient instructions You may have bloody urine for two to three days (Call your doctor if the amount of bleeding increases or does not subside).  You may pass blood clots in your urine, especially if you had a biopsy. It is not unusual to pass small blood clots and have some bloody urine a couple of weeks after your cystoscopy. Again, call your doctor if the bleeding does not subside. You may have: Dysuria (painful urination) Frequency (urinating often) Urgency (strong desire to urinate)  You can use tylenol for pain.  AZO can also be used for burning with urination. This is found at the pharmacy.  These symptoms are common especially if medicine is instilled into the bladder or a ureteral stent is placed. Avoiding alcohol and caffeine, such as coffee, tea, and chocolate, may help relieve these symptoms. Drink plenty of water, unless otherwise instructed. Your doctor may also prescribe an antibiotic or other medicine to reduce these symptoms.  Cystoscopy results are available soon after the procedure; biopsy results usually take two to four days. Your doctor will discuss the results of your exam with you. Before you go home, you will be given specific instructions for follow-up care. Special Instructions:   If you are going home with a catheter in place do not take a tub bath until removed by your doctor.   You may resume your normal activities.   Do not drive or operate machinery if you are taking narcotic pain medicine.   Be sure to keep all follow-up appointments with your doctor.   Call Your Doctor If: The catheter is not draining You have severe pain You are unable to urinate You have a fever over 101 You have severe bleeding

## 2023-04-08 NOTE — Anesthesia Postprocedure Evaluation (Signed)
 Anesthesia Post Note  Patient: Erin Webster  Procedure(s) Performed: CYSTOSCOPY (Urethra) INJECTION, BULKING AGENT, URETHRA (Urethra)     Patient location during evaluation: PACU Anesthesia Type: MAC Level of consciousness: awake and alert, patient cooperative and oriented Pain management: pain level controlled Vital Signs Assessment: post-procedure vital signs reviewed and stable Respiratory status: spontaneous breathing, nonlabored ventilation and respiratory function stable Cardiovascular status: blood pressure returned to baseline and stable Postop Assessment: no apparent nausea or vomiting and adequate PO intake Anesthetic complications: no   No notable events documented.  Last Vitals:  Vitals:   04/08/23 0951 04/08/23 1000  BP:  118/72  Pulse:  (!) 54  Resp:  13  Temp:    SpO2: 97% 98%    Last Pain:  Vitals:   04/08/23 0945  TempSrc:   PainSc: 0-No pain                 Jannette Cotham,E. Ander Wamser

## 2023-04-08 NOTE — Anesthesia Preprocedure Evaluation (Addendum)
 Anesthesia Evaluation  Patient identified by MRN, date of birth, ID band Patient awake    Reviewed: Allergy & Precautions, NPO status , Patient's Chart, lab work & pertinent test results  History of Anesthesia Complications Negative for: history of anesthetic complications  Airway Mallampati: II  TM Distance: >3 FB Neck ROM: Full    Dental  (+) Edentulous Upper, Edentulous Lower, Dental Advisory Given, Upper Dentures, Lower Dentures   Pulmonary neg pulmonary ROS   breath sounds clear to auscultation       Cardiovascular hypertension, Pt. on medications and Pt. on home beta blockers (-) angina  Rhythm:Regular Rate:Normal     Neuro/Psych negative neurological ROS     GI/Hepatic negative GI ROS, Neg liver ROS,,,  Endo/Other  diabetes (glu 173), Oral Hypoglycemic Agents    Renal/GU negative Renal ROSH/o stones     Musculoskeletal   Abdominal   Peds  Hematology   Anesthesia Other Findings   Reproductive/Obstetrics                             Anesthesia Physical Anesthesia Plan  ASA: 3  Anesthesia Plan: MAC   Post-op Pain Management: Tylenol PO (pre-op)*   Induction:   PONV Risk Score and Plan: 3 and Ondansetron, Dexamethasone and Treatment may vary due to age or medical condition  Airway Management Planned: Natural Airway and Simple Face Mask  Additional Equipment: None  Intra-op Plan:   Post-operative Plan:   Informed Consent: I have reviewed the patients History and Physical, chart, labs and discussed the procedure including the risks, benefits and alternatives for the proposed anesthesia with the patient or authorized representative who has indicated his/her understanding and acceptance.     Dental advisory given  Plan Discussed with: CRNA and Surgeon  Anesthesia Plan Comments:         Anesthesia Quick Evaluation

## 2023-04-08 NOTE — Anesthesia Procedure Notes (Signed)
 Procedure Name: MAC Date/Time: 04/08/2023 9:12 AM  Performed by: Micki Riley, CRNAPre-anesthesia Checklist: Patient identified, Emergency Drugs available, Suction available, Patient being monitored and Timeout performed Patient Re-evaluated:Patient Re-evaluated prior to induction Oxygen Delivery Method: Circle system utilized Preoxygenation: Pre-oxygenation with 100% oxygen Induction Type: IV induction

## 2023-04-09 ENCOUNTER — Encounter (HOSPITAL_COMMUNITY): Payer: Self-pay | Admitting: Urology

## 2023-04-22 DIAGNOSIS — R8271 Bacteriuria: Secondary | ICD-10-CM | POA: Diagnosis not present

## 2023-04-22 DIAGNOSIS — N952 Postmenopausal atrophic vaginitis: Secondary | ICD-10-CM | POA: Diagnosis not present

## 2023-04-22 DIAGNOSIS — N3946 Mixed incontinence: Secondary | ICD-10-CM | POA: Diagnosis not present

## 2023-04-22 DIAGNOSIS — N3642 Intrinsic sphincter deficiency (ISD): Secondary | ICD-10-CM | POA: Diagnosis not present

## 2023-04-22 DIAGNOSIS — N311 Reflex neuropathic bladder, not elsewhere classified: Secondary | ICD-10-CM | POA: Diagnosis not present

## 2023-04-30 DIAGNOSIS — M898X9 Other specified disorders of bone, unspecified site: Secondary | ICD-10-CM | POA: Diagnosis not present

## 2023-04-30 DIAGNOSIS — Z6827 Body mass index (BMI) 27.0-27.9, adult: Secondary | ICD-10-CM | POA: Diagnosis not present

## 2023-05-25 DIAGNOSIS — E559 Vitamin D deficiency, unspecified: Secondary | ICD-10-CM | POA: Diagnosis not present

## 2023-05-25 DIAGNOSIS — E1165 Type 2 diabetes mellitus with hyperglycemia: Secondary | ICD-10-CM | POA: Diagnosis not present

## 2023-05-25 DIAGNOSIS — Z79899 Other long term (current) drug therapy: Secondary | ICD-10-CM | POA: Diagnosis not present

## 2023-05-25 DIAGNOSIS — Z8639 Personal history of other endocrine, nutritional and metabolic disease: Secondary | ICD-10-CM | POA: Diagnosis not present

## 2023-05-25 DIAGNOSIS — M7918 Myalgia, other site: Secondary | ICD-10-CM | POA: Diagnosis not present

## 2023-05-25 DIAGNOSIS — I10 Essential (primary) hypertension: Secondary | ICD-10-CM | POA: Diagnosis not present

## 2023-05-25 DIAGNOSIS — E669 Obesity, unspecified: Secondary | ICD-10-CM | POA: Diagnosis not present

## 2023-05-25 DIAGNOSIS — R718 Other abnormality of red blood cells: Secondary | ICD-10-CM | POA: Diagnosis not present

## 2023-05-25 DIAGNOSIS — Z6826 Body mass index (BMI) 26.0-26.9, adult: Secondary | ICD-10-CM | POA: Diagnosis not present

## 2023-05-25 DIAGNOSIS — E1169 Type 2 diabetes mellitus with other specified complication: Secondary | ICD-10-CM | POA: Diagnosis not present

## 2023-06-07 DIAGNOSIS — M7918 Myalgia, other site: Secondary | ICD-10-CM | POA: Diagnosis not present

## 2023-06-07 DIAGNOSIS — E1165 Type 2 diabetes mellitus with hyperglycemia: Secondary | ICD-10-CM | POA: Diagnosis not present

## 2023-06-07 DIAGNOSIS — Z6826 Body mass index (BMI) 26.0-26.9, adult: Secondary | ICD-10-CM | POA: Diagnosis not present

## 2023-07-28 DIAGNOSIS — M6281 Muscle weakness (generalized): Secondary | ICD-10-CM | POA: Diagnosis not present

## 2023-07-28 DIAGNOSIS — E1165 Type 2 diabetes mellitus with hyperglycemia: Secondary | ICD-10-CM | POA: Diagnosis not present

## 2023-07-28 DIAGNOSIS — Z0000 Encounter for general adult medical examination without abnormal findings: Secondary | ICD-10-CM | POA: Diagnosis not present

## 2023-07-28 DIAGNOSIS — Z91148 Patient's other noncompliance with medication regimen for other reason: Secondary | ICD-10-CM | POA: Diagnosis not present

## 2023-07-28 DIAGNOSIS — M7542 Impingement syndrome of left shoulder: Secondary | ICD-10-CM | POA: Diagnosis not present

## 2023-07-28 DIAGNOSIS — E1169 Type 2 diabetes mellitus with other specified complication: Secondary | ICD-10-CM | POA: Diagnosis not present

## 2023-08-09 DIAGNOSIS — M25512 Pain in left shoulder: Secondary | ICD-10-CM | POA: Diagnosis not present

## 2023-08-09 DIAGNOSIS — M75122 Complete rotator cuff tear or rupture of left shoulder, not specified as traumatic: Secondary | ICD-10-CM | POA: Diagnosis not present

## 2023-09-16 DIAGNOSIS — H5203 Hypermetropia, bilateral: Secondary | ICD-10-CM | POA: Diagnosis not present

## 2023-09-16 DIAGNOSIS — H25013 Cortical age-related cataract, bilateral: Secondary | ICD-10-CM | POA: Diagnosis not present

## 2023-09-16 DIAGNOSIS — H524 Presbyopia: Secondary | ICD-10-CM | POA: Diagnosis not present

## 2023-09-16 DIAGNOSIS — H52223 Regular astigmatism, bilateral: Secondary | ICD-10-CM | POA: Diagnosis not present

## 2023-09-16 DIAGNOSIS — H43812 Vitreous degeneration, left eye: Secondary | ICD-10-CM | POA: Diagnosis not present

## 2023-09-16 DIAGNOSIS — H2513 Age-related nuclear cataract, bilateral: Secondary | ICD-10-CM | POA: Diagnosis not present

## 2023-09-16 DIAGNOSIS — H35373 Puckering of macula, bilateral: Secondary | ICD-10-CM | POA: Diagnosis not present

## 2023-09-16 DIAGNOSIS — H04123 Dry eye syndrome of bilateral lacrimal glands: Secondary | ICD-10-CM | POA: Diagnosis not present

## 2023-09-16 DIAGNOSIS — E119 Type 2 diabetes mellitus without complications: Secondary | ICD-10-CM | POA: Diagnosis not present

## 2023-09-23 DIAGNOSIS — I10 Essential (primary) hypertension: Secondary | ICD-10-CM | POA: Diagnosis not present

## 2023-09-23 DIAGNOSIS — E1165 Type 2 diabetes mellitus with hyperglycemia: Secondary | ICD-10-CM | POA: Diagnosis not present

## 2023-10-25 DIAGNOSIS — L820 Inflamed seborrheic keratosis: Secondary | ICD-10-CM | POA: Diagnosis not present

## 2023-10-25 DIAGNOSIS — L814 Other melanin hyperpigmentation: Secondary | ICD-10-CM | POA: Diagnosis not present

## 2023-10-25 DIAGNOSIS — L578 Other skin changes due to chronic exposure to nonionizing radiation: Secondary | ICD-10-CM | POA: Diagnosis not present
